# Patient Record
Sex: Female | Born: 1990 | Race: Black or African American | Hispanic: No | Marital: Married | Smoking: Never smoker
Health system: Southern US, Community
[De-identification: ages and names within clinical notes are randomized; demographics above are authoritative.]

## PROBLEM LIST (undated history)

## (undated) DIAGNOSIS — F319 Bipolar disorder, unspecified: Secondary | ICD-10-CM

## (undated) DIAGNOSIS — D649 Anemia, unspecified: Secondary | ICD-10-CM

## (undated) DIAGNOSIS — F259 Schizoaffective disorder, unspecified: Secondary | ICD-10-CM

## (undated) DIAGNOSIS — D696 Thrombocytopenia, unspecified: Secondary | ICD-10-CM

## (undated) DIAGNOSIS — O99119 Other diseases of the blood and blood-forming organs and certain disorders involving the immune mechanism complicating pregnancy, unspecified trimester: Secondary | ICD-10-CM

## (undated) DIAGNOSIS — O139 Gestational [pregnancy-induced] hypertension without significant proteinuria, unspecified trimester: Secondary | ICD-10-CM

## (undated) HISTORY — PX: NEPHRECTOMY: SHX65

---

## 2012-03-09 HISTORY — PX: NEPHRECTOMY: SHX65

## 2020-03-09 NOTE — L&D Delivery Note (Addendum)
OB/GYN Faculty Practice Delivery Note  Holly Craig is a 30 y.o. [id number] s/p SVD at [redacted]w[redacted]d. She was admitted for SROM with contractions.   ROM: 13h 9m with clear fluid - per patient GBS Status: Negative/-- (09/09 1104) Maximum Maternal Temperature: 98.9  Labor Progress: Patient presented to L&D for SROM. Initial SVE: 4/70/-2. She then progressed to complete with Pitocin augmentation.   Delivery Date/Time: 12/25/2020 @2019  Delivery: Called to room and patient was complete and pushing. Head delivered ROA. No nuchal cord present. Shoulder and body delivered in usual fashion. Infant with spontaneous cry, placed on mother's abdomen, dried and stimulated. Cord clamped x 2 after 1-minute delay, and cut by myself. Cord blood drawn. Placenta delivered spontaneously with gentle cord traction. Fundus firm with massage and Pitocin. Labia, perineum, vagina, and cervix inspected without any lacerations appreciated. Placenta was examined after and found to be intact with normal 3-vessel cord.   Placenta: Intact Complications: None Lacerations: None EBL: Analgesia: Epidural  Postpartum Planning [x]  message to sent to schedule follow-up   Infant: APGARs 8/9  pending weight  , DO 12/25/2020, 8:35 PM PGY-1, [facility]  Patient is a Holly Craig at [redacted]w[redacted]d who was admitted in latent labor with significant hx of 2 VBACs in between previous C-sections, psych history, onset of care at 34wks, and gest thrombocytopenia. She progressed with augmentation via Pitocin.  I was gloved and present for delivery in its entirety.  Second stage of labor progressed, baby delivered after one contraction.  Mild decels during second stage noted.  Complications: none  Lacerations: none  EBL: 100cc  Holly Craig, CNM 10:29 PM 12/25/2020

## 2020-09-27 ENCOUNTER — Inpatient Hospital Stay (HOSPITAL_COMMUNITY)
Admission: AD | Admit: 2020-09-27 | Discharge: 2020-09-27 | Disposition: A | Discharge status: Home / Self Care | Payer: Medicaid Other | Attending: Obstetrics & Gynecology | Admitting: Obstetrics & Gynecology

## 2020-09-27 ENCOUNTER — Other Ambulatory Visit: Payer: Self-pay

## 2020-09-27 ENCOUNTER — Encounter (HOSPITAL_COMMUNITY): Payer: Self-pay | Admitting: Obstetrics & Gynecology

## 2020-09-27 DIAGNOSIS — O0932 Supervision of pregnancy with insufficient antenatal care, second trimester: Secondary | ICD-10-CM

## 2020-09-27 DIAGNOSIS — O26893 Other specified pregnancy related conditions, third trimester: Secondary | ICD-10-CM | POA: Insufficient documentation

## 2020-09-27 DIAGNOSIS — Z3A27 27 weeks gestation of pregnancy: Secondary | ICD-10-CM | POA: Insufficient documentation

## 2020-09-27 NOTE — MAU Note (Signed)
Pt reports she is here to get a check up, states she is 6 months pregnant and has not had any care. Denies pain, bleeding or ROM. Reports good fetal movement.

## 2020-09-27 NOTE — MAU Provider Note (Signed)
Event Date/Time   First Provider Initiated Contact with Patient 09/27/20 2247      S Ms. Holly Craig is a 30 y.o. G1P0 patient who presents to MAU wanting a check up. Patient reports that she just moved from [place] 2 weeks ago and has not had any prenatal visits recently. She denies pain or contractions, leaking fluid, or vaginal bleeding. No headache, vision changes, or RUQ/epigastric pain. Endorses active fetal movement.     O BP 122/75 (BP Location: Right Arm)   Pulse 86   Temp 98 F (36.7 C)   Resp 18   Ht 5\' 1"  (1.549 m)   Wt 96.2 kg   SpO2 100%   BMI 40.06 kg/m   FHR: 132 bpm via doppler  Physical Exam Constitutional:      Appearance: She is obese.  Cardiovascular:     Rate and Rhythm: Normal rate.  Pulmonary:     Effort: Pulmonary effort is normal.  Abdominal:     Palpations: Abdomen is soft.     Tenderness: There is no abdominal tenderness.     Comments: Gravid   Musculoskeletal:        General: Normal range of motion.  Neurological:     General: No focal deficit present.     Mental Status: She is alert and oriented to person, place, and time.  Psychiatric:        Mood and Affect: Mood normal.        Behavior: Behavior normal.        Thought Content: Thought content normal.        Judgment: Judgment normal.    A Medical screening exam complete [redacted] weeks gestation of pregnancy   P Discharge from MAU in stable condition Message sent to [facility] to schedule new ob ASAP. Patient informed someone will call to schedule appointment Instructed patient to bring prenatal records to appointment Warning signs for worsening condition that would warrant emergency follow-up discussed Patient may return to MAU as needed     [facility], MSN, CNM 09/27/2020 10:59 PM

## 2020-09-27 NOTE — Discharge Instructions (Signed)
Safe Medications in Pregnancy    Acne: Benzoyl Peroxide Salicylic Acid  Backache/Headache: Tylenol: 2 regular strength every 4 hours OR              2 Extra strength every 6 hours  Colds/Coughs/Allergies: Benadryl (alcohol free) 25 mg every 6 hours as needed Breath right strips Claritin Cepacol throat lozenges Chloraseptic throat spray Cold-Eeze- up to three times per day Cough drops, alcohol free Flonase (by prescription only) Guaifenesin Mucinex Robitussin DM (plain only, alcohol free) Saline nasal spray/drops Sudafed (pseudoephedrine) & Actifed ** use only after [redacted] weeks gestation and if you do not have high blood pressure Tylenol Vicks Vaporub Zinc lozenges Zyrtec   Constipation: Colace Ducolax suppositories Fleet enema Glycerin suppositories Metamucil Milk of magnesia Miralax Senokot Smooth move tea  Diarrhea: Kaopectate Imodium A-D  *NO pepto Bismol  Hemorrhoids: Anusol Anusol HC Preparation H Tucks  Indigestion: Tums Maalox Mylanta Zantac  Pepcid  Insomnia: Benadryl (alcohol free) 25mg  every 6 hours as needed Tylenol PM Unisom, no Gelcaps  Leg Cramps: Tums MagGel  Nausea/Vomiting:  Bonine Dramamine Emetrol Ginger extract Sea bands Meclizine  Nausea medication to take during pregnancy:  Unisom (doxylamine succinate 25 mg tablets) Take one tablet daily at bedtime. If symptoms are not adequately controlled, the dose can be increased to a maximum recommended dose of two tablets daily (1/2 tablet in the morning, 1/2 tablet mid-afternoon and one at bedtime). Vitamin B6 100mg  tablets. Take one tablet twice a day (up to 200 mg per day).  Skin Rashes: Aveeno products Benadryl cream or 25mg  every 6 hours as needed Calamine Lotion 1% cortisone cream  Yeast infection: Gyne-lotrimin 7 Monistat 7   **If taking multiple medications, please check labels to avoid duplicating the same active ingredients **take  medication as directed on the label ** Do not exceed 4000 mg of tylenol in 24 hours **Do not take medications that contain aspirin or ibuprofen   [facility] Guide (Revised August 2014)    Insufficient Money for Medicine:           Way: call "211"   MAP Program at [facility] Department - [place] [phone] or [place] [phone]            No Primary Care Doctor:  To locate a primary care doctor that accepts your insurance or provides certain services:           [place] [facility]: [phone]           Physician Referral Service: [phone] ask for "My [place]" If no insurance, you need to see if you qualify for [facility] "orange card", call to set      up appointment for eligibility/enrollment at [phone] or [phone] or visit [facility]. of Health and [phone] ([address], [place] and [address]) to meet with a [facility] enrollment specialist.  Agencies that provide inexpensive (sliding fee scale) medical care:      [facility] - Family Medicine at [place] - [phone]    [facility]  -  [facility] [phone]    [facility] Internal Medicine - [phone]    [facility] Care & Wellness - [phone]    [facility] [phone]    [facility] Health Family Practice [phone] [facility] - [facility] Child Health @ [place] [phone] [facility] - [place] [facility]  [facility] @ [place] - [phone] [facility]: [phone]  Women's Clinic: [phone]  Planned Parenthood: [phone]  [facility] [phone]    Medicaid-accepting [facility] Providers:           [facility] - [phone] (No Family Planning accepted)          [address], [phone], Mon-Fri 9am-5pm          [facility] - [phone] [address] [place], Suite [place], [place] 8am-5pm, Fri 8am-noon [employer] - [phone]          [address], Mon-Fri 7:30am-4:30pm          [facility] Family Medicine - [phone]          [address], [place] 8am-5pm          [facility] - [phone] [address]          Only accepts [place] [employer] patients after they have their name applied to their card  Self Pay (no insurance) in [facility]:           Sickle Cell Patients:  [address] [place], [phone] [facility] Internal Medicine: [address] [place] [phone]       [facility] and Wellness [address] [place] [phone]  [facility] Family Practice: [address] [phone]          [facility] Urgent Care           [address] [place], [phone] [facility] for Children [address] [place], [phone]           [facility]gent Care [place]           [address], [place] [phone]        [facility] - [address]           [phone], Mon-Fri 9am-7pm, [place] 9am-1pm          [facility] - Family Medicine @ [facility]          [address] [place], [phone]          [facility] - [facility]           [address] [phone] [facility] - [facility] [address] [place] [phone]          [facility]           [address] [phone]  [facility] - [facility]  [address] [place], [phone] [facility] - [facility] [address] [phone]  Dr. Julio Sicks           [address], [place], [phone]          [facility] Urgent Care           [address] [phone]          [facility]             [address], [phone]  [facility]           [address] [place], [phone], 1st & 3rd Saturday every month, 10am-1pm OTHERS:  [employer]  [employer] Only)  [phone] (Thursday only) Strategies for finding a Primary Care Provider:  1) Find a Doctor and Pay Out of Pocket  Although you won't have to find out who is covered by your insurance plan, it is a good idea to ask around and get recommendations. You will then need to call the office and see if the doctor you have chosen will accept you as a new patient and what types of options they offer for patients who are self-pay. Some doctors offer discounts or will set up payment plans for their patients who do not have insurance, but you will need to ask so you aren't surprised when you get to your appointment.  2) Contact [place] [employer] - To see if you qualify for "orange card" access to healthcare safety net providers.  Call for appointment for eligibility/enrollment at [phone] or [phone]. (Uninsured, 0-200% FPL, qualifying info)  Applicants for [facility] are first required to see if they are eligible to enroll in the [facility] Marketplace before enrolling in [facility] (and get an exemption if they are not).  GCCN Criteria for acceptance is:  Proof of Engineering geologist exemption - form or documentation  Valid photo ID (driver's license, state identification card, passport, home country ID)  Proof of [facility] residency (e.g. driver's license, lease/landlord information, pay stubs with address, utility bill, bank statement, etc.)  Proof of income (1040, last year's tax return, W2, 4 current pay stubs, other income proof)  Proof of assets (current bank statement + 3 most recent, disability paperwork, life insurance info, tax value on autos, etc.)  3) Contact Your Local Health Department  Not all health departments have  doctors that can see patients for sick visits, but many do, so it is worth a call to see if yours does. If you don't know where your local health department is, you can check in your phone book. The CDC also has a tool to help you locate your state's health department, and many state websites also have listings of all of their local health departments.  4) Find a Walk-in Clinic  If your illness is not likely to be very severe or complicated, you may want to try a walk in clinic. These are popping up all over the country in pharmacies, drugstores, and shopping centers. They're usually staffed by nurse practitioners or physician assistants that have been trained to treat common illnesses and complaints. They're usually fairly quick and inexpensive. However, if you have serious medical issues or chronic medical problems, these are probably not your best option   STD Testing:           [facility] [place], [place] [facility]           [address] [place], phone [phone] or [phone]           Monday - Friday, call for an appointment          [facility] Department of [facility]), [place] [facility]           [address], [place], phone [phone] or [phone]           Monday - Friday, call for an appointment Abuse/Neglect:           [facility]  Child Abuse Hotline: 213-847-0758           [facility] Child Abuse Hotline: 416-773-4766 (After Hours) Emergency Shelter:  [facility] [phone]  [facility] [place]- [phone]  [facility] [place] - [phone]  Youth Focus - Act Together - 9362420095 (ages 33-17)  Homeless Day Shelter @ [employer] - [phone]   Mammograms - Free at [facility] [phone]  Maternity Homes:           Room at the [place] of the [facility]: [phone]   (Homeless mother with children)          [facility] Alert Services: [phone]  (Mothers only) Youth Focus: [phone] (Pregnant 70-38 years old) Adopt a Mom -([phone]  [facility]  [facility] - Lanae Boast [address] [place] [phone]          [facility]           [address] [place]           [phone]          Gypsy           [address] [place], [place]           [phone]          [facility] Dept.           [address], [place]           [phone]          [facility] Mental Health           [phone]          [facility]           [phone]          [facility] [place]           [address]           [phone], [facility] Child Abuse Hotline           912-710-5861           [phone] (After Hours)  Behavioral Health Services /Substance Abuse Resources:           Alcohol and Drug Services: [phone]           [facility]: [phone]          [facility] [address] [facility]: [phone]  Narcotics Helpline - [phone]          [facility]: 6053536353           Residential & Outpatient Substance Abuse Program - [facility] [place]: 216-039-6542 [employer]  Behavioral Health and Wellness Center - 4848147745 Psychological Services:          [facility]: [phone]  Therapeutic Alternatives: [phone]          [facility] Mental Health           [address] [place]           ACCESS LINE: [phone]     (24 Hour) Mobile Crisis:  HELPLINES:  Financial risk analyst on Mental Illness - [place] [phone] [facility] on Mental Illness - [place] [phone] Walk In Crisis Services  [facility] - [address] - [place]  [phone]       [facility] - [phone] or [phone]  [facility] - [phone]  [address] - [employer] [phone]  [facility] [phone]. [address] [place] [phone]   Dental Assistance:  If unable to pay or uninsured, contact: [facility]. to become qualified for the adult dental clinic. Patient must be enrolled in [facility] (uninsured, 0-200% FPL, qualifying info).  Enroll in [facility] first, then see Primary Care Physician assigned to you, the PCP makes a dental referral. [place] [facility] will receive referral and contacts patient for appointment.  Patients with Medicaid           [address], [phone]  [place] [facility] (Children up to 20 + Pregnant Women) - [phone]  [facility] Dentistry - [address] - [address] [phone]  If unable to pay, or uninsured: contact [facility] Department [phone] in [place] - (Children only + Pregnant Women), [phone] in [facility] only) to become qualified for the adult dental clinic  Must see if eligible to enroll in [facility] Marketplace before enrolling into the [facility] (exemption required) [phone] for an appointment)  [employer];   [phone].  If not eligible for ACA, then go by Department of Health and Human Services to see if eligible for "orange card."  [address], [place] and [phone]  Once you get an orange card, you will have a Primary Care home who will then refer you to dental if needed.     Other IT consultant:   [facility] [phone] (ext [phone])   [address]  Dr. Lawrence [place]se[place] - [phone]   [address]    [place] - [phone]   [facility]           [address] [place], [place], [place], [address]           [phone], Ext. [phone]           2nd and 4th Thursday of the month at 6:30am (Simple extractions only - no wisdom teeth or surgery) First come/First serve -First 10 clients  served           [facility] [place], [place] and [place] residents only)          [address] [place], [place], [address]           [phone]                    [facility] Health Department           [phone]          [facility] Health Department          [phone]         [facility] Health Department - Children's Dental Clinic          [phone]   Transportation Options:  Ambulance - 911 - $[phone] per ride Family Member to accompany patient (if stable) [place] [employer] - [phone]  PART - [phone]  Taxi - [phone] - [place]  SCAT - [phone] (Application required)  [facility] - [phone]

## 2020-10-27 ENCOUNTER — Encounter (HOSPITAL_COMMUNITY): Payer: Self-pay | Admitting: Obstetrics and Gynecology

## 2020-10-27 ENCOUNTER — Inpatient Hospital Stay (HOSPITAL_COMMUNITY)
Admission: AD | Admit: 2020-10-27 | Discharge: 2020-10-27 | Disposition: A | Discharge status: Home / Self Care | Payer: Medicaid Other | Attending: Obstetrics and Gynecology | Admitting: Obstetrics and Gynecology

## 2020-10-27 ENCOUNTER — Other Ambulatory Visit: Payer: Self-pay

## 2020-10-27 DIAGNOSIS — N751 Abscess of Bartholin's gland: Secondary | ICD-10-CM | POA: Insufficient documentation

## 2020-10-27 DIAGNOSIS — Z98891 History of uterine scar from previous surgery: Secondary | ICD-10-CM | POA: Insufficient documentation

## 2020-10-27 DIAGNOSIS — Z3A32 32 weeks gestation of pregnancy: Secondary | ICD-10-CM | POA: Insufficient documentation

## 2020-10-27 DIAGNOSIS — Z79899 Other long term (current) drug therapy: Secondary | ICD-10-CM | POA: Insufficient documentation

## 2020-10-27 DIAGNOSIS — O26893 Other specified pregnancy related conditions, third trimester: Secondary | ICD-10-CM | POA: Diagnosis present

## 2020-10-27 DIAGNOSIS — Z3689 Encounter for other specified antenatal screening: Secondary | ICD-10-CM | POA: Insufficient documentation

## 2020-10-27 DIAGNOSIS — R102 Pelvic and perineal pain: Secondary | ICD-10-CM | POA: Diagnosis not present

## 2020-10-27 DIAGNOSIS — F319 Bipolar disorder, unspecified: Secondary | ICD-10-CM | POA: Insufficient documentation

## 2020-10-27 DIAGNOSIS — F259 Schizoaffective disorder, unspecified: Secondary | ICD-10-CM | POA: Insufficient documentation

## 2020-10-27 DIAGNOSIS — O23593 Infection of other part of genital tract in pregnancy, third trimester: Secondary | ICD-10-CM | POA: Diagnosis not present

## 2020-10-27 DIAGNOSIS — Z8759 Personal history of other complications of pregnancy, childbirth and the puerperium: Secondary | ICD-10-CM | POA: Insufficient documentation

## 2020-10-27 HISTORY — DX: Anemia, unspecified: D64.9

## 2020-10-27 LAB — OB RESULTS CONSOLE GC/CHLAMYDIA: Gonorrhea: NEGATIVE

## 2020-10-27 LAB — WET PREP, GENITAL
Clue Cells Wet Prep HPF POC: NONE SEEN
Sperm: NONE SEEN
Trich, Wet Prep: NONE SEEN
WBC, Wet Prep HPF POC: NONE SEEN
Yeast Wet Prep HPF POC: NONE SEEN

## 2020-10-27 LAB — URINALYSIS, ROUTINE W REFLEX MICROSCOPIC
Bilirubin Urine: NEGATIVE
Glucose, UA: NEGATIVE mg/dL
Hgb urine dipstick: NEGATIVE
Ketones, ur: NEGATIVE mg/dL
Leukocytes,Ua: NEGATIVE
Nitrite: NEGATIVE
Protein, ur: 30 mg/dL — AB
Specific Gravity, Urine: 1.029 (ref 1.005–1.030)
pH: 5 (ref 5.0–8.0)

## 2020-10-27 MED ORDER — LIDOCAINE HCL (PF) 1 % IJ SOLN
30.0000 mL | Freq: Once | INTRAMUSCULAR | Status: DC
  Filled 2020-10-27: qty 30

## 2020-10-27 NOTE — MAU Note (Deleted)
Prior to transfer to [place] vaginal swabs collected-perineum dry without evidence of vaginal bleeding or discharge.

## 2020-10-27 NOTE — MAU Provider Note (Signed)
History     CSN: [id number]  Arrival date and time: 10/27/20 1653   Event Date/Time   First Provider Initiated Contact with Patient 10/27/20 1834      Chief Complaint  Patient presents with   Groin Swelling   30 y.o. [id number] @32 .1 wks presenting with pain and bump in vaginal area. Reports onset of swelling and pain 3 days ago. States she had this before in the Spring and was opened and drained. She denies VB, LOF, or abd pain. Reports +FM. [place] about 2 mos ago and has not started care yet. Hx of Bipolar, has not been taking the Haldol.   OB History     Gravida  10   Para  4   Term  4   Preterm      AB  5   Living  4      SAB  5   IAB      Ectopic      Multiple      Live Births  4           Past Medical History:  Diagnosis Date   Anemia     Past Surgical History:  Procedure Laterality Date   CESAREAN SECTION     NEPHRECTOMY      History reviewed. No pertinent family history.  Social History   Substance Use Topics   Alcohol use: Not Currently   Drug use: Never    Allergies:  Allergies  Allergen Reactions   Ibuprofen     Medications Prior to Admission  Medication Sig Dispense Refill Last Dose   Prenatal Vit-Fe Fumarate-FA (MULTIVITAMIN-PRENATAL) 27-0.8 MG TABS tablet Take 1 tablet by mouth daily at 12 noon.   10/27/2020    Review of Systems  Gastrointestinal:  Negative for abdominal pain.  Genitourinary:  Positive for vaginal pain. Negative for vaginal bleeding and vaginal discharge.  Physical Exam   Blood pressure 129/85, pulse 88, temperature 98.1 F (36.7 C), resp. rate 16, height 5\' 1"  (1.549 m), weight 99.9 kg, SpO2 97 %.  Physical Exam Vitals and nursing note reviewed.  Constitutional:      General: She is not in acute distress.    Appearance: Normal appearance.  HENT:     Head: Normocephalic and atraumatic.  Cardiovascular:     Rate and Rhythm: Normal rate.  Pulmonary:     Effort: Pulmonary  effort is normal. No respiratory distress.  Abdominal:     Palpations: Abdomen is soft.     Tenderness: There is no abdominal tenderness.  Genitourinary:   Musculoskeletal:        General: Normal range of motion.     Cervical back: Normal range of motion.  Skin:    General: Skin is warm.  Neurological:     General: No focal deficit present.     Mental Status: She is alert and oriented to person, place, and time.  Psychiatric:        Mood and Affect: Mood normal.        Behavior: Behavior normal.  EFM: 145 bpm, mod variability, + accels, no decels Toco: rare  Results for orders placed or performed during the hospital encounter of 10/27/20 (from the past 24 hour(s))  Urinalysis, Routine w reflex microscopic Urine, Clean Catch     Status: Abnormal   Collection Time: 10/27/20  5:34 PM  Result Value Ref Range   Color, Urine YELLOW YELLOW   APPearance CLOUDY (A) CLEAR   Specific  Gravity, Urine 1.029 1.005 - 1.030   pH 5.0 5.0 - 8.0   Glucose, UA NEGATIVE NEGATIVE mg/dL   Hgb urine dipstick NEGATIVE NEGATIVE   Bilirubin Urine NEGATIVE NEGATIVE   Ketones, ur NEGATIVE NEGATIVE mg/dL   Protein, ur 30 (A) NEGATIVE mg/dL   Nitrite NEGATIVE NEGATIVE   Leukocytes,Ua NEGATIVE NEGATIVE   RBC / HPF 0-5 0 - 5 RBC/hpf   WBC, UA 0-5 0 - 5 WBC/hpf   Bacteria, UA MANY (A) NONE SEEN   Squamous Epithelial / LPF 21-50 0 - 5   Mucus PRESENT   Wet prep, genital     Status: None   Collection Time: 10/27/20  6:35 PM   Specimen: Vaginal  Result Value Ref Range   Yeast Wet Prep HPF POC NONE SEEN NONE SEEN   Trich, Wet Prep NONE SEEN NONE SEEN   Clue Cells Wet Prep HPF POC NONE SEEN NONE SEEN   WBC, Wet Prep HPF POC NONE SEEN NONE SEEN   Sperm NONE SEEN    MAU Course  Procedures  Consent form signed. Time out.   Patient positioned and draped with sterile towels.  Preoperative medication: Percocet 2 tablet po    Area cleaned with betadine Local infiltrate with lidocaine 1%. Amount 5  ccs  I&D Scalpel size: #11blade Incision type: Straight single Complexity: Complex Drained moderate amount of purulent drainage Probed with curved hemostat to break up loculations Packing: none Patient tolerance: Tolerated procedure well.    MDM Reviewed records in CareEverywhere from [place]: last prenatal visit at 12 wks, hx of severe pre-e in first pregnancy, hx VBAC x2, hx Bipolar. Labs ordered and reviewed. Stable for discharge home.  Assessment and Plan   1. [redacted] weeks gestation of pregnancy   2. NST (non-stress test) reactive   3. Bartholin's gland abscess    Discharge home Follow up at [facility] in 1 week- message sent Return precautions Tylenol prn  Allergies as of 10/27/2020       Reactions   Ibuprofen         Medication List     TAKE these medications    multivitamin-prenatal 27-0.8 MG Tabs tablet Take 1 tablet by mouth daily at 12 noon.       Donette Larry, CNM 10/27/2020, 8:06 PM

## 2020-10-27 NOTE — MAU Note (Signed)
Peri pad check prior to discharge-noted quarter size bright red blood spot-pt denies pain-no active bleeding or swelling noted. Reviewed discharge and follow up instructions with pt and family following providers instruction. Questions answered. Discharge papers in hand.

## 2020-10-27 NOTE — MAU Note (Signed)
Holly Craig is a 30 y.o. at [redacted]w[redacted]d here in MAU reporting: left sided vaginal swelling and pressure since 8/18. States she has noticed some bumps and had some bumps before and had surgery to that area and some medicine. This she has an infection but cannot recall what kind. No bleeding. +FM. No abdominal pain. Pt is not receiving PNC.  Onset of complaint: ongoing  Pain score: 0/10  Vitals:   10/27/20 1731  BP: (!) 120/95  Pulse: 96  Resp: 16  Temp: 98.1 F (36.7 C)  SpO2: 95%     FHT: doppler deferred, pt wearing a long dress, reporting +FM  Lab orders placed from triage: UA

## 2020-10-28 LAB — GC/CHLAMYDIA PROBE AMP (~~LOC~~) NOT AT ARMC
Chlamydia: NEGATIVE
Comment: NEGATIVE
Comment: NORMAL
Neisseria Gonorrhea: NEGATIVE

## 2020-11-13 ENCOUNTER — Encounter: Payer: Medicaid Other | Admitting: Nurse Practitioner

## 2020-11-15 ENCOUNTER — Encounter: Payer: Self-pay | Admitting: Obstetrics and Gynecology

## 2020-11-15 ENCOUNTER — Other Ambulatory Visit: Payer: Self-pay

## 2020-11-15 ENCOUNTER — Other Ambulatory Visit (HOSPITAL_COMMUNITY)
Admission: RE | Admit: 2020-11-15 | Discharge: 2020-11-15 | Disposition: A | Discharge status: Home / Self Care | Payer: Medicaid Other | Source: Ambulatory Visit | Attending: Obstetrics and Gynecology | Admitting: Obstetrics and Gynecology

## 2020-11-15 ENCOUNTER — Ambulatory Visit (INDEPENDENT_AMBULATORY_CARE_PROVIDER_SITE_OTHER): Payer: Medicaid Other | Admitting: Obstetrics and Gynecology

## 2020-11-15 VITALS — BP 107/73 | HR 96 | Wt 222.3 lb

## 2020-11-15 DIAGNOSIS — Z3A34 34 weeks gestation of pregnancy: Secondary | ICD-10-CM

## 2020-11-15 DIAGNOSIS — Z905 Acquired absence of kidney: Secondary | ICD-10-CM

## 2020-11-15 DIAGNOSIS — F319 Bipolar disorder, unspecified: Secondary | ICD-10-CM

## 2020-11-15 DIAGNOSIS — O2343 Unspecified infection of urinary tract in pregnancy, third trimester: Secondary | ICD-10-CM

## 2020-11-15 DIAGNOSIS — Z8759 Personal history of other complications of pregnancy, childbirth and the puerperium: Secondary | ICD-10-CM

## 2020-11-15 DIAGNOSIS — O0993 Supervision of high risk pregnancy, unspecified, third trimester: Secondary | ICD-10-CM | POA: Diagnosis present

## 2020-11-15 DIAGNOSIS — Z98891 History of uterine scar from previous surgery: Secondary | ICD-10-CM

## 2020-11-15 DIAGNOSIS — F259 Schizoaffective disorder, unspecified: Secondary | ICD-10-CM

## 2020-11-15 DIAGNOSIS — D696 Thrombocytopenia, unspecified: Secondary | ICD-10-CM

## 2020-11-15 DIAGNOSIS — O99113 Other diseases of the blood and blood-forming organs and certain disorders involving the immune mechanism complicating pregnancy, third trimester: Secondary | ICD-10-CM

## 2020-11-15 DIAGNOSIS — O0990 Supervision of high risk pregnancy, unspecified, unspecified trimester: Secondary | ICD-10-CM

## 2020-11-15 DIAGNOSIS — O99810 Abnormal glucose complicating pregnancy: Secondary | ICD-10-CM

## 2020-11-15 DIAGNOSIS — Z6841 Body Mass Index (BMI) 40.0 and over, adult: Secondary | ICD-10-CM

## 2020-11-15 DIAGNOSIS — O0930 Supervision of pregnancy with insufficient antenatal care, unspecified trimester: Secondary | ICD-10-CM | POA: Insufficient documentation

## 2020-11-15 DIAGNOSIS — O0933 Supervision of pregnancy with insufficient antenatal care, third trimester: Secondary | ICD-10-CM

## 2020-11-15 DIAGNOSIS — O99210 Obesity complicating pregnancy, unspecified trimester: Secondary | ICD-10-CM

## 2020-11-15 NOTE — Progress Notes (Signed)
PRENATAL VISIT NOTE  Transfer of care visit  Patient has just moved here from [place]  Subjective:  Holly Craig is a 30 y.o. [id number] at [redacted]w[redacted]d being seen today for ongoing prenatal care.  She is currently monitored for the following issues for this high-risk pregnancy and has History of severe pre-eclampsia; Schizoaffective disorder (HCC); Bipolar 1 disorder (HCC); History of cesarean delivery; Late prenatal care; Supervision of high risk pregnancy, antepartum; and Insufficient prenatal care in third trimester on their problem list.  Patient reports no complaints.  Contractions: Not present. Vag. Bleeding: None.  Movement: Present. Denies leaking of fluid.   The following portions of the patient's history were reviewed and updated as appropriate: allergies, current medications, past family history, past medical history, past social history, past surgical history and problem list.   Objective:   Vitals:   11/15/20 1002  BP: 107/73  Pulse: 96  Weight: 222 lb 4.8 oz (100.8 kg)    Fetal Status: Fetal Heart Rate (bpm): 143 Fundal Height: 34 cm Movement: Present  Presentation: Vertex  General:  Alert, oriented and cooperative. Patient is in no acute distress.  Skin: Skin is warm and dry. No rash noted.   Cardiovascular: Normal heart rate noted  Respiratory: Normal respiratory effort, no problems with respiration noted  Abdomen: Soft, gravid, appropriate for gestational age.  Pain/Pressure: Absent     Pelvic: Cervical exam performed in the presence of a chaperone EGBUS, vaginal vault and cervix normal; visually closed, long  Extremities: Normal range of motion.  Edema: Trace  Mental Status: Normal mood and affect. Normal behavior. Normal judgment and thought content.   Assessment and Plan:  Pregnancy: [id number] at 101w6d 1. Supervision of high risk pregnancy in third trimester Patient states she was getting her care at the [facility] but there is nothing in care everywhere  except for her mental health notes (see below). She states there were no issues with her pregnancy  GBS and OB labs done today and MFM u/s appt made.   Pt declines BTL.   - Cytology - PAP( Mondamin) - CBC/D/Plt+RPR+Rh+ABO+Rub Ab... - Glucose tolerance, 1 hour - [place] MFM OB DETAIL +14 WK; Future - Culture, OB Urine - Comprehensive metabolic panel - Protein / creatinine ratio, urine  2. Bipolar 1 disorder (HCC) Extensive mental health notes from [facility] in care everywhere with last visit on 9/6 and it looks like they were seeing her weekly and she had two inpatient admissions this year.   It looks like she last got IM Haldol in May with Fonda Kinder  It also looks like they were trying for many weeks to set her up with the [facility] program; pt is unaware of any visits and there is nothing in care everywhere and it looks like they left a VM for the director. I called [facility] but they could not give me the number for Pamalee Leyden and I called the clinic at [phone] but no answer. I left a VM with my number to call me back. Asher Muir with behavorial health is not here today but referral made to her and message sent to her about resources for patient.   I called across the street and they said she came come anytime to the urgent care but next provider visit is out until November and no way for her to be seen earlier. They also said that they do not do weekly sessions. Pt currently denies any auditory or visual hallucinations but her affect  today is somewhat flat. I told her about the facility across the street to go to 24/7 for any issues.   I called [facility] but they do not do medication management - Ambulatory referral to Integrated Behavioral Health  3. Schizoaffective disorder, unspecified type (HCC) - Ambulatory referral to Integrated Behavioral Health  4. Insufficient prenatal care in third trimester  5. Supervision of high risk pregnancy, antepartum - Strep Gp B NAA  6. History of cesarean  delivery Request sent for 39wk c/s  7. History of severe pre-eclampsia  8. BMI 40.0-44.9, adult (HCC)  9. Obesity in pregnancy  10. [redacted] weeks gestation of pregnancy  11. History of nephrectomy, left She states she had pain in this area and they said it was damaged; removed in 2014.  Cmp today  Preterm labor symptoms and general obstetric precautions including but not limited to vaginal bleeding, contractions, leaking of fluid and fetal movement were reviewed in detail with the patient. Please refer to After Visit Summary for other counseling recommendations.   No follow-ups on file.  Future Appointments  Date Time Provider Department Center  11/26/2020  7:30 AM [facility] NURSE [facility]  11/26/2020  7:45 AM [facility] US4 [facility]    [place] Bing, MD

## 2020-11-16 LAB — CBC/D/PLT+RPR+RH+ABO+RUBIGG...
Antibody Screen: NEGATIVE
Basophils Absolute: 0 10*3/uL (ref 0.0–0.2)
Basos: 0 %
EOS (ABSOLUTE): 0.1 10*3/uL (ref 0.0–0.4)
Eos: 1 %
HCV Ab: <0.1 s/co ratio (ref 0.0–0.9)
HIV Screen 4th Generation wRfx: NONREACTIVE
Hematocrit: 33.6 % — ABNORMAL LOW (ref 34.0–46.6)
Hemoglobin: 11.3 g/dL (ref 11.1–15.9)
Hepatitis B Surface Ag: NEGATIVE
Immature Grans (Abs): 0.1 10*3/uL (ref 0.0–0.1)
Immature Granulocytes: 1 %
Lymphocytes Absolute: 1.3 10*3/uL (ref 0.7–3.1)
Lymphs: 25 %
MCH: 28.7 pg (ref 26.6–33.0)
MCHC: 33.6 g/dL (ref 31.5–35.7)
MCV: 85 fL (ref 79–97)
Monocytes Absolute: 0.5 10*3/uL (ref 0.1–0.9)
Monocytes: 9 %
Neutrophils Absolute: 3.3 10*3/uL (ref 1.4–7.0)
Neutrophils: 64 %
Platelets: 86 10*3/uL — CL (ref 150–450)
RBC: 3.94 x10E6/uL (ref 3.77–5.28)
RDW: 13.6 % (ref 11.7–15.4)
RPR Ser Ql: NONREACTIVE
Rh Factor: POSITIVE
Rubella Antibodies, IGG: 31.1 index (ref >0.99)
WBC: 5.2 10*3/uL (ref 3.4–10.8)

## 2020-11-16 LAB — COMPREHENSIVE METABOLIC PANEL
ALT: 8 IU/L (ref 0–32)
AST: 13 IU/L (ref 0–40)
Albumin/Globulin Ratio: 1.6 (ref 1.2–2.2)
Albumin: 3.8 g/dL (ref 3.8–4.8)
Alkaline Phosphatase: 90 IU/L (ref 44–121)
BUN/Creatinine Ratio: 13 (ref 9–23)
BUN: 7 mg/dL (ref 6–20)
Bilirubin Total: 0.9 mg/dL (ref 0.0–1.2)
CO2: 19 mmol/L — ABNORMAL LOW (ref 20–29)
Calcium: 9.1 mg/dL (ref 8.7–10.2)
Chloride: 103 mmol/L (ref 96–106)
Creatinine, Ser: 0.54 mg/dL — ABNORMAL LOW (ref 0.57–1.00)
Globulin, Total: 2.4 g/dL (ref 1.5–4.5)
Glucose: 151 mg/dL — ABNORMAL HIGH (ref 65–99)
Potassium: 3.9 mmol/L (ref 3.5–5.2)
Sodium: 137 mmol/L (ref 134–144)
Total Protein: 6.2 g/dL (ref 6.0–8.5)
eGFR: 126 mL/min/{1.73_m2} (ref >59)

## 2020-11-16 LAB — PROTEIN / CREATININE RATIO, URINE
Creatinine, Urine: 89 mg/dL
Protein, Ur: 17.1 mg/dL
Protein/Creat Ratio: 192 mg/g creat (ref 0–200)

## 2020-11-16 LAB — HCV INTERPRETATION

## 2020-11-16 LAB — GLUCOSE TOLERANCE, 1 HOUR: Glucose, 1Hr PP: 149 mg/dL (ref 65–199)

## 2020-11-17 LAB — STREP GP B NAA: Strep Gp B NAA: NEGATIVE

## 2020-11-18 ENCOUNTER — Telehealth: Payer: Self-pay

## 2020-11-18 DIAGNOSIS — Z6841 Body Mass Index (BMI) 40.0 and over, adult: Secondary | ICD-10-CM | POA: Insufficient documentation

## 2020-11-18 DIAGNOSIS — D696 Thrombocytopenia, unspecified: Secondary | ICD-10-CM | POA: Insufficient documentation

## 2020-11-18 DIAGNOSIS — O99210 Obesity complicating pregnancy, unspecified trimester: Secondary | ICD-10-CM | POA: Insufficient documentation

## 2020-11-18 DIAGNOSIS — O99113 Other diseases of the blood and blood-forming organs and certain disorders involving the immune mechanism complicating pregnancy, third trimester: Secondary | ICD-10-CM | POA: Insufficient documentation

## 2020-11-18 DIAGNOSIS — Z905 Acquired absence of kidney: Secondary | ICD-10-CM | POA: Insufficient documentation

## 2020-11-18 DIAGNOSIS — O99810 Abnormal glucose complicating pregnancy: Secondary | ICD-10-CM | POA: Insufficient documentation

## 2020-11-18 NOTE — Telephone Encounter (Signed)
-----   Message from [place] Bing, MD sent at 11/18/2020  8:24 AM EDT ----- Can you let her know that she failed her 1h GTT and needs a 2h and that we need to repeat her platelet count to see if it's low? Her next visit is on 9/20, but can you ask her to come in for a lab only visit before that; I've put in future orders.   Also, is there anyway to get her to be seen at the office in [facility] for her OB visits and labs? It looks like it's only a mile from address  Thanks

## 2020-11-18 NOTE — Telephone Encounter (Signed)
Call placed to pt. Spoke with pt. Pt given results and recommendations per Dr Vergie Living. Pt verbalized understanding. Pt scheduled for lab appt 9/15 at 930am and instructed to be fasting. Pt verbalized understanding and agreeable to date and time of appt.  Judeth Cornfield, RN

## 2020-11-21 ENCOUNTER — Other Ambulatory Visit: Payer: Medicaid Other

## 2020-11-21 ENCOUNTER — Other Ambulatory Visit: Payer: Self-pay

## 2020-11-21 DIAGNOSIS — D696 Thrombocytopenia, unspecified: Secondary | ICD-10-CM

## 2020-11-21 DIAGNOSIS — O99113 Other diseases of the blood and blood-forming organs and certain disorders involving the immune mechanism complicating pregnancy, third trimester: Secondary | ICD-10-CM

## 2020-11-21 DIAGNOSIS — O99810 Abnormal glucose complicating pregnancy: Secondary | ICD-10-CM

## 2020-11-21 DIAGNOSIS — O0993 Supervision of high risk pregnancy, unspecified, third trimester: Secondary | ICD-10-CM

## 2020-11-21 LAB — CYTOLOGY - PAP
Comment: NEGATIVE
Diagnosis: NEGATIVE
High risk HPV: NEGATIVE

## 2020-11-21 LAB — URINE CULTURE, OB REFLEX

## 2020-11-21 LAB — CULTURE, OB URINE

## 2020-11-22 ENCOUNTER — Telehealth: Payer: Self-pay | Admitting: Obstetrics and Gynecology

## 2020-11-22 DIAGNOSIS — O2343 Unspecified infection of urinary tract in pregnancy, third trimester: Secondary | ICD-10-CM | POA: Insufficient documentation

## 2020-11-22 LAB — GLUCOSE TOLERANCE, 2 HOURS W/ 1HR
Glucose, 1 hour: 157 mg/dL (ref 65–179)
Glucose, 2 hour: 104 mg/dL (ref 65–152)
Glucose, Fasting: 87 mg/dL (ref 65–91)

## 2020-11-22 LAB — PLATELET COUNT: Platelets: 93 10*3/uL — CL (ref 150–450)

## 2020-11-22 MED ORDER — CEFADROXIL 500 MG PO CAPS: 500.0000 mg | ORAL_CAPSULE | Freq: Two times a day (BID) | ORAL | 0 refills | Status: DC

## 2020-11-22 NOTE — Addendum Note (Signed)
Addended by: [place] Bing on: 11/22/2020 01:53 PM   Modules accepted: Orders

## 2020-11-26 ENCOUNTER — Encounter: Payer: Medicaid Other | Admitting: Family Medicine

## 2020-11-26 ENCOUNTER — Telehealth: Payer: Self-pay | Admitting: Clinical

## 2020-11-26 ENCOUNTER — Encounter: Payer: Self-pay | Admitting: *Deleted

## 2020-11-26 ENCOUNTER — Ambulatory Visit: Payer: Medicaid Other | Attending: Obstetrics and Gynecology

## 2020-11-26 ENCOUNTER — Other Ambulatory Visit: Payer: Self-pay

## 2020-11-26 ENCOUNTER — Ambulatory Visit: Payer: Medicaid Other | Admitting: *Deleted

## 2020-11-26 VITALS — BP 110/73 | HR 85

## 2020-11-26 DIAGNOSIS — Z98891 History of uterine scar from previous surgery: Secondary | ICD-10-CM | POA: Insufficient documentation

## 2020-11-26 DIAGNOSIS — Z8759 Personal history of other complications of pregnancy, childbirth and the puerperium: Secondary | ICD-10-CM | POA: Diagnosis present

## 2020-11-26 DIAGNOSIS — F319 Bipolar disorder, unspecified: Secondary | ICD-10-CM | POA: Diagnosis present

## 2020-11-26 DIAGNOSIS — O0993 Supervision of high risk pregnancy, unspecified, third trimester: Secondary | ICD-10-CM | POA: Insufficient documentation

## 2020-11-26 NOTE — Telephone Encounter (Signed)
Attempt to follow up with pt regarding ongoing BH care; Left HIPPA-compliant message to call back Asher Muir from [employer] for [employer] at [facility] at  [phone] [facility] office).

## 2020-11-27 ENCOUNTER — Telehealth: Payer: Self-pay | Admitting: *Deleted

## 2020-11-27 ENCOUNTER — Encounter: Payer: Self-pay | Admitting: *Deleted

## 2020-11-27 NOTE — Telephone Encounter (Signed)
Call to patient. Female answers and states he is not with patient. Requested call back to office. Female states he is Holly Post, her husband and able to give information. Advised calling about procedure scheduling- he is only aware of appointment  next week. Advised calling about future appointments and will speak next week at appointment.    Letter will be mailed.

## 2020-12-03 ENCOUNTER — Telehealth (HOSPITAL_COMMUNITY): Payer: Self-pay | Admitting: *Deleted

## 2020-12-03 ENCOUNTER — Encounter (HOSPITAL_COMMUNITY): Payer: Self-pay

## 2020-12-03 NOTE — Telephone Encounter (Signed)
Preadmission screen  

## 2020-12-03 NOTE — Telephone Encounter (Signed)
OB Telephone Note  Patient called and husband answered. Labs and plan of care d/w him. He states that she is doing fine in terms of pscyh  I did hear from [facility] and they feel that she is too far away for their services to be helpful.  Cornelia Copa MD Attending Center for [employer] (Faculty Practice) 11/22/2020

## 2020-12-03 NOTE — Patient Instructions (Signed)
Holly Craig  12/03/2020   Your procedure is scheduled on:  12/16/2020  Arrive at 1030 at Entrance C on [address] at [facility]  and [address]. You are invited to use the FREE valet parking or use the Visitor's parking deck.  Pick up the phone at the desk and dial [phone].  Call this number if you have problems the morning of surgery: [phone]  Remember:   Do not eat food:(After Midnight) Desps de medianoche.  Do not drink clear liquids: (After Midnight) Desps de medianoche.  Take these medicines the morning of surgery with A SIP OF WATER:  none   Do not wear jewelry, make-up or nail polish.  Do not wear lotions, powders, or perfumes. Do not wear deodorant.  Do not shave 48 hours prior to surgery.  Do not bring valuables to the hospital.  [facility] is not   responsible for any belongings or valuables brought to the hospital.  Contacts, dentures or bridgework may not be worn into surgery.  Leave suitcase in the car. After surgery it may be brought to your room.  For patients admitted to the hospital, checkout time is 11:00 AM the day of              discharge.      Please read over the following fact sheets that you were given:     Preparing for Surgery

## 2020-12-04 ENCOUNTER — Telehealth: Payer: Self-pay | Admitting: Clinical

## 2020-12-04 ENCOUNTER — Other Ambulatory Visit: Payer: Self-pay

## 2020-12-04 ENCOUNTER — Other Ambulatory Visit (HOSPITAL_COMMUNITY)
Admission: RE | Admit: 2020-12-04 | Discharge: 2020-12-04 | Disposition: A | Discharge status: Home / Self Care | Payer: Medicaid Other | Source: Ambulatory Visit | Attending: Family Medicine | Admitting: Family Medicine

## 2020-12-04 ENCOUNTER — Ambulatory Visit (INDEPENDENT_AMBULATORY_CARE_PROVIDER_SITE_OTHER): Payer: Medicaid Other | Admitting: Family Medicine

## 2020-12-04 VITALS — BP 124/56 | HR 83 | Wt 228.0 lb

## 2020-12-04 DIAGNOSIS — O99113 Other diseases of the blood and blood-forming organs and certain disorders involving the immune mechanism complicating pregnancy, third trimester: Secondary | ICD-10-CM

## 2020-12-04 DIAGNOSIS — D696 Thrombocytopenia, unspecified: Secondary | ICD-10-CM

## 2020-12-04 DIAGNOSIS — F250 Schizoaffective disorder, bipolar type: Secondary | ICD-10-CM

## 2020-12-04 DIAGNOSIS — F319 Bipolar disorder, unspecified: Secondary | ICD-10-CM

## 2020-12-04 DIAGNOSIS — O0993 Supervision of high risk pregnancy, unspecified, third trimester: Secondary | ICD-10-CM

## 2020-12-04 DIAGNOSIS — Z8759 Personal history of other complications of pregnancy, childbirth and the puerperium: Secondary | ICD-10-CM

## 2020-12-04 DIAGNOSIS — Z98891 History of uterine scar from previous surgery: Secondary | ICD-10-CM

## 2020-12-04 DIAGNOSIS — O0933 Supervision of pregnancy with insufficient antenatal care, third trimester: Secondary | ICD-10-CM

## 2020-12-04 NOTE — Telephone Encounter (Signed)
Attempt to schedule pt for [facility] visit after referral; pt declined [facility] appointment at this time; says she does not need to start back on [facility] medication (Haldol) at this time, has no questions or concerns.   Pt would benefit from psychiatry consult prior to discharge after delivery at [facility], as well as social work consult to assess for any additional needs.

## 2020-12-04 NOTE — Progress Notes (Signed)
   PRENATAL VISIT NOTE  Subjective:  Holly Craig is a 30 y.o. [id number] at [redacted]w[redacted]d being seen today for ongoing prenatal care.  She is currently monitored for the following issues for this high-risk pregnancy and has History of severe pre-eclampsia; Schizoaffective disorder (HCC); Bipolar 1 disorder (HCC); History of cesarean delivery; Late prenatal care; Supervision of high risk pregnancy in third trimester; Insufficient prenatal care in third trimester; Benign gestational thrombocytopenia in third trimester (HCC); History of nephrectomy, left; Obesity in pregnancy; BMI 40.0-44.9, adult (HCC); and UTI (urinary tract infection) in pregnancy, antepartum, third trimester on their problem list.  Patient reports no complaints.  Contractions: Not present. Vag. Bleeding: None.  Movement: Present. Denies leaking of fluid.   The following portions of the patient's history were reviewed and updated as appropriate: allergies, current medications, past family history, past medical history, past social history, past surgical history and problem list.   Objective:   Vitals:   12/04/20 0845  BP: (!) 124/56  Pulse: 83  Weight: 228 lb (103.4 kg)    Fetal Status: Fetal Heart Rate (bpm): 140   Movement: Present     General:  Alert, oriented and cooperative. Patient is in no acute distress.  Skin: Skin is warm and dry. No rash noted.   Cardiovascular: Normal heart rate noted  Respiratory: Normal respiratory effort, no problems with respiration noted  Abdomen: Soft, gravid, appropriate for gestational age.  Pain/Pressure: Absent     Pelvic: Cervical exam deferred        Extremities: Normal range of motion.  Edema: Trace  Mental Status: Normal mood and affect. Normal behavior. Normal judgment and thought content.   Assessment and Plan:  Pregnancy: [id number] at [redacted]w[redacted]d 1. Supervision of high risk pregnancy in third trimester FHT and FH normal - Platelet count; Future - GC/Chlamydia probe amp ([facility])not at [facility]  2. Schizoaffective disorder, bipolar type (HCC) Will schedule with Jamie  3. Bipolar 1 disorder (HCC) No currently treated. Will schedule with Marijean Niemann. Not able to get into [facility]  4. Insufficient prenatal care in third trimester  5. History of severe pre-eclampsia BP normal  6. History of cesarean delivery Desires TOLAC. Has two successful VBACs. Last c/s for malposition. Does not want to be induced.  TOLAC consent form reviewed and signed by patient.  7. Benign gestational thrombocytopenia in third trimester (HCC) Last platelets >90. Recheck today.  May need steroids. I discussed potential for delivery at 39 weeks, but she does not want to be induced or scheduled for c/s.  Term labor symptoms and general obstetric precautions including but not limited to vaginal bleeding, contractions, leaking of fluid and fetal movement were reviewed in detail with the patient. Please refer to After Visit Summary for other counseling recommendations.   No follow-ups on file.  Future Appointments  Date Time Provider Department Center  12/13/2020 10:00 AM [facility] PAT 1 [facility] None  01/01/2021 11:15 AM Levie Heritage, DO [facility] None  01/29/2021 10:15 AM Adrian Blackwater Rhona Raider, DO [facility] None    Levie Heritage, DO

## 2020-12-05 LAB — GC/CHLAMYDIA PROBE AMP (~~LOC~~) NOT AT ARMC
Chlamydia: NEGATIVE
Comment: NEGATIVE
Comment: NORMAL
Neisseria Gonorrhea: NEGATIVE

## 2020-12-05 LAB — PLATELET COUNT: Platelets: 98 10*3/uL — CL (ref 150–450)

## 2020-12-11 ENCOUNTER — Other Ambulatory Visit: Payer: Self-pay

## 2020-12-11 ENCOUNTER — Ambulatory Visit (INDEPENDENT_AMBULATORY_CARE_PROVIDER_SITE_OTHER): Payer: Medicaid Other | Admitting: Family Medicine

## 2020-12-11 VITALS — BP 125/69 | HR 88 | Wt 228.0 lb

## 2020-12-11 DIAGNOSIS — O0933 Supervision of pregnancy with insufficient antenatal care, third trimester: Secondary | ICD-10-CM

## 2020-12-11 DIAGNOSIS — F319 Bipolar disorder, unspecified: Secondary | ICD-10-CM

## 2020-12-11 DIAGNOSIS — Z98891 History of uterine scar from previous surgery: Secondary | ICD-10-CM

## 2020-12-11 DIAGNOSIS — D696 Thrombocytopenia, unspecified: Secondary | ICD-10-CM

## 2020-12-11 DIAGNOSIS — Z8759 Personal history of other complications of pregnancy, childbirth and the puerperium: Secondary | ICD-10-CM

## 2020-12-11 DIAGNOSIS — O0993 Supervision of high risk pregnancy, unspecified, third trimester: Secondary | ICD-10-CM

## 2020-12-11 DIAGNOSIS — O99113 Other diseases of the blood and blood-forming organs and certain disorders involving the immune mechanism complicating pregnancy, third trimester: Secondary | ICD-10-CM

## 2020-12-11 NOTE — Progress Notes (Signed)
   PRENATAL VISIT NOTE  Subjective:  Holly Craig is a 30 y.o. [id number] at [redacted]w[redacted]d being seen today for ongoing prenatal care.  She is currently monitored for the following issues for this high-risk pregnancy and has History of severe pre-eclampsia; Schizoaffective disorder (HCC); Bipolar 1 disorder (HCC); History of cesarean delivery; Late prenatal care; Supervision of high risk pregnancy in third trimester; Insufficient prenatal care in third trimester; Benign gestational thrombocytopenia in third trimester (HCC); History of nephrectomy, left; Obesity in pregnancy; BMI 40.0-44.9, adult (HCC); and UTI (urinary tract infection) in pregnancy, antepartum, third trimester on their problem list.  Patient reports no complaints.  Contractions: Not present. Vag. Bleeding: None.  Movement: Present. Denies leaking of fluid.   The following portions of the patient's history were reviewed and updated as appropriate: allergies, current medications, past family history, past medical history, past social history, past surgical history and problem list.   Objective:   Vitals:   12/11/20 0935  BP: 125/69  Pulse: 88  Weight: 228 lb (103.4 kg)    Fetal Status: Fetal Heart Rate (bpm): 145 Fundal Height: 38 cm Movement: Present  Presentation: Vertex  General:  Alert, oriented and cooperative. Patient is in no acute distress.  Skin: Skin is warm and dry. No rash noted.   Cardiovascular: Normal heart rate noted  Respiratory: Normal respiratory effort, no problems with respiration noted  Abdomen: Soft, gravid, appropriate for gestational age.  Pain/Pressure: Absent     Pelvic: Cervical exam deferred        Extremities: Normal range of motion.  Edema: Mild pitting, slight indentation  Mental Status: Normal mood and affect. Normal behavior. Normal judgment and thought content.   Assessment and Plan:  Pregnancy: [id number] at [redacted]w[redacted]d 1. Supervision of high risk pregnancy in third trimester FHT and FH  normal  2. Insufficient prenatal care in third trimester  3. History of severe pre-eclampsia BP normal  4. Bipolar 1 disorder (HCC) Not on medications. Patient denies diagnosis  5. History of cesarean delivery Desires VBAC I discussed that we would not induce her with 2 prior c/s. If she gets to 41 weeks, she would be amenable to have c/s.  6. Benign gestational thrombocytopenia in third trimester (HCC) Platelets improved. Recheck next week.  Term labor symptoms and general obstetric precautions including but not limited to vaginal bleeding, contractions, leaking of fluid and fetal movement were reviewed in detail with the patient. Please refer to After Visit Summary for other counseling recommendations.   No follow-ups on file.  Future Appointments  Date Time Provider Department Center  12/13/2020 10:00 AM [facility] PAT 1 [facility] None  01/01/2021 11:15 AM Levie Heritage, DO [facility] None  01/29/2021 10:15 AM Adrian Blackwater Rhona Raider, DO [facility] None    Levie Heritage, DO

## 2020-12-11 NOTE — Progress Notes (Signed)
Patient desires vaginal delivery. Hx of 2 previous c-sections. Armandina Stammer RN

## 2020-12-13 ENCOUNTER — Encounter (HOSPITAL_COMMUNITY)
Admission: RE | Admit: 2020-12-13 | Discharge: 2020-12-13 | Disposition: A | Discharge status: Home / Self Care | Payer: Medicaid Other | Source: Ambulatory Visit | Attending: Family Medicine | Admitting: Family Medicine

## 2020-12-13 HISTORY — DX: Bipolar disorder, unspecified: F31.9

## 2020-12-13 HISTORY — DX: Schizoaffective disorder, unspecified: F25.9

## 2020-12-16 ENCOUNTER — Encounter (HOSPITAL_COMMUNITY): Payer: Self-pay

## 2020-12-16 ENCOUNTER — Encounter (HOSPITAL_COMMUNITY): Admission: RE | Payer: Self-pay | Source: Ambulatory Visit

## 2020-12-16 ENCOUNTER — Inpatient Hospital Stay (HOSPITAL_COMMUNITY): Admission: RE | Admit: 2020-12-16 | Payer: Medicaid Other | Source: Ambulatory Visit | Admitting: Family Medicine

## 2020-12-16 DIAGNOSIS — Z98891 History of uterine scar from previous surgery: Secondary | ICD-10-CM

## 2020-12-16 DIAGNOSIS — Z8759 Personal history of other complications of pregnancy, childbirth and the puerperium: Secondary | ICD-10-CM

## 2020-12-16 DIAGNOSIS — F250 Schizoaffective disorder, bipolar type: Secondary | ICD-10-CM

## 2020-12-16 DIAGNOSIS — O0993 Supervision of high risk pregnancy, unspecified, third trimester: Secondary | ICD-10-CM

## 2020-12-16 DIAGNOSIS — F319 Bipolar disorder, unspecified: Secondary | ICD-10-CM

## 2020-12-16 SURGERY — Surgical Case
Anesthesia: Regional

## 2020-12-16 NOTE — Progress Notes (Signed)
Attempted to call patient two times via phone number available in chart to determine if she would be coming to the hospital for her scheduled cesarean section. No answer from patient. Dr. Crissie Reese notified.

## 2020-12-20 ENCOUNTER — Other Ambulatory Visit: Payer: Self-pay

## 2020-12-20 ENCOUNTER — Encounter: Payer: Self-pay | Admitting: Obstetrics and Gynecology

## 2020-12-20 ENCOUNTER — Telehealth: Payer: Self-pay | Admitting: *Deleted

## 2020-12-20 ENCOUNTER — Ambulatory Visit (INDEPENDENT_AMBULATORY_CARE_PROVIDER_SITE_OTHER): Payer: Medicaid Other | Admitting: Obstetrics and Gynecology

## 2020-12-20 ENCOUNTER — Encounter: Payer: Medicaid Other | Admitting: Obstetrics and Gynecology

## 2020-12-20 VITALS — BP 129/73 | HR 73 | Wt 230.0 lb

## 2020-12-20 DIAGNOSIS — O99210 Obesity complicating pregnancy, unspecified trimester: Secondary | ICD-10-CM

## 2020-12-20 DIAGNOSIS — O99113 Other diseases of the blood and blood-forming organs and certain disorders involving the immune mechanism complicating pregnancy, third trimester: Secondary | ICD-10-CM

## 2020-12-20 DIAGNOSIS — O0993 Supervision of high risk pregnancy, unspecified, third trimester: Secondary | ICD-10-CM

## 2020-12-20 DIAGNOSIS — O0930 Supervision of pregnancy with insufficient antenatal care, unspecified trimester: Secondary | ICD-10-CM

## 2020-12-20 DIAGNOSIS — Z98891 History of uterine scar from previous surgery: Secondary | ICD-10-CM

## 2020-12-20 DIAGNOSIS — D696 Thrombocytopenia, unspecified: Secondary | ICD-10-CM

## 2020-12-20 NOTE — Progress Notes (Signed)
   PRENATAL VISIT NOTE  Subjective:  Holly Craig is a 30 y.o. [id number] at [redacted]w[redacted]d being seen today for ongoing prenatal care.  She is currently monitored for the following issues for this high-risk pregnancy and has History of severe pre-eclampsia; Schizoaffective disorder (HCC); Bipolar 1 disorder (HCC); History of cesarean delivery; Late prenatal care; Supervision of high risk pregnancy in third trimester; Insufficient prenatal care in third trimester; Benign gestational thrombocytopenia in third trimester (HCC); History of nephrectomy, left; Obesity in pregnancy; BMI 40.0-44.9, adult (HCC); and UTI (urinary tract infection) in pregnancy, antepartum, third trimester on their problem list.  Patient reports no complaints.  Contractions: Not present. Vag. Bleeding: None.  Movement: Present. Denies leaking of fluid.   The following portions of the patient's history were reviewed and updated as appropriate: allergies, current medications, past family history, past medical history, past social history, past surgical history and problem list.   Objective:   Vitals:   12/20/20 1049  BP: 129/73  Pulse: 73  Weight: 230 lb (104.3 kg)    Fetal Status: Fetal Heart Rate (bpm): 145   Movement: Present   FH 40  General:  Alert, oriented and cooperative. Patient is in no acute distress.  Skin: Skin is warm and dry. No rash noted.   Cardiovascular: Normal heart rate noted  Respiratory: Normal respiratory effort, no problems with respiration noted  Abdomen: Soft, gravid, appropriate for gestational age.  Pain/Pressure: Absent     Pelvic: Cervical exam deferred        Extremities: Normal range of motion.  Edema: Trace  Mental Status: Normal mood and affect. Normal behavior. Normal judgment and thought content.   Assessment and Plan:  Pregnancy: [id number] at [redacted]w[redacted]d 1. Benign gestational thrombocytopenia in third trimester (HCC) - Recheck platelets today - 2 weeks ago stable a 98  2. History of  cesarean delivery - Scheduled for next Saturday for RLTCS if no labor prior to then - she will not come in for c-section prior to this day. She desires vbac but 2 prior c-sections, so no induction. We discussed NST/BPP since going past EDC. She is amenable to this.   3. Late prenatal care  4. Supervision of high risk pregnancy in third trimester - Flu offered and recommended - pt accepts - f/u in one week  5. Obesity in pregnancy   Term labor symptoms and general obstetric precautions including but not limited to vaginal bleeding, contractions, leaking of fluid and fetal movement were reviewed in detail with the patient. Please refer to After Visit Summary for other counseling recommendations.   Return in about 1 week (around 12/27/2020) for OB VISIT, MD only, Needs BPP/NST monday.  Future Appointments  Date Time Provider Department Center  12/25/2020  9:55 AM Levie Heritage, DO [facility] None     Milas Hock, MD

## 2020-12-20 NOTE — Telephone Encounter (Signed)
Call to patient- husband answers and states he is listed on release and just left appointment with her- he has now left home. Advised c-section scheduled for 12-28-20 at 0930 and he reports patient does not want c-section. Advised this is scheduled one week after due date, just in case and he states "if nothing wrong, she will not keep appointment." Advised it is scheduled and will update physician.  Encounter closed.

## 2020-12-24 ENCOUNTER — Encounter (HOSPITAL_COMMUNITY): Payer: Self-pay | Admitting: Obstetrics & Gynecology

## 2020-12-24 ENCOUNTER — Telehealth (HOSPITAL_COMMUNITY): Payer: Self-pay | Admitting: *Deleted

## 2020-12-24 NOTE — Telephone Encounter (Signed)
Preadmission screen  

## 2020-12-25 ENCOUNTER — Encounter (HOSPITAL_COMMUNITY): Payer: Self-pay | Admitting: Obstetrics and Gynecology

## 2020-12-25 ENCOUNTER — Encounter (HOSPITAL_COMMUNITY): Payer: Self-pay | Admitting: Anesthesiology

## 2020-12-25 ENCOUNTER — Encounter: Payer: Medicaid Other | Admitting: Family Medicine

## 2020-12-25 ENCOUNTER — Inpatient Hospital Stay (HOSPITAL_COMMUNITY): Payer: Medicaid Other | Admitting: Anesthesiology

## 2020-12-25 ENCOUNTER — Ambulatory Visit: Payer: Medicaid Other | Admitting: Family Medicine

## 2020-12-25 ENCOUNTER — Inpatient Hospital Stay (HOSPITAL_COMMUNITY)
Admission: AD | Admit: 2020-12-25 | Discharge: 2020-12-27 | DRG: 806 | Disposition: A | Discharge status: Home / Self Care | Payer: Medicaid Other | Attending: Obstetrics and Gynecology | Admitting: Obstetrics and Gynecology

## 2020-12-25 ENCOUNTER — Other Ambulatory Visit: Payer: Self-pay

## 2020-12-25 DIAGNOSIS — O99892 Other specified diseases and conditions complicating childbirth: Secondary | ICD-10-CM | POA: Diagnosis present

## 2020-12-25 DIAGNOSIS — Z98891 History of uterine scar from previous surgery: Secondary | ICD-10-CM

## 2020-12-25 DIAGNOSIS — D696 Thrombocytopenia, unspecified: Secondary | ICD-10-CM | POA: Diagnosis present

## 2020-12-25 DIAGNOSIS — Z23 Encounter for immunization: Secondary | ICD-10-CM

## 2020-12-25 DIAGNOSIS — Z3A40 40 weeks gestation of pregnancy: Secondary | ICD-10-CM

## 2020-12-25 DIAGNOSIS — Z20822 Contact with and (suspected) exposure to covid-19: Secondary | ICD-10-CM | POA: Diagnosis present

## 2020-12-25 DIAGNOSIS — F250 Schizoaffective disorder, bipolar type: Secondary | ICD-10-CM

## 2020-12-25 DIAGNOSIS — F23 Brief psychotic disorder: Secondary | ICD-10-CM | POA: Diagnosis not present

## 2020-12-25 DIAGNOSIS — F259 Schizoaffective disorder, unspecified: Secondary | ICD-10-CM | POA: Diagnosis present

## 2020-12-25 DIAGNOSIS — O9912 Other diseases of the blood and blood-forming organs and certain disorders involving the immune mechanism complicating childbirth: Secondary | ICD-10-CM | POA: Diagnosis present

## 2020-12-25 DIAGNOSIS — R000 Tachycardia, unspecified: Secondary | ICD-10-CM | POA: Diagnosis present

## 2020-12-25 DIAGNOSIS — O4202 Full-term premature rupture of membranes, onset of labor within 24 hours of rupture: Secondary | ICD-10-CM

## 2020-12-25 DIAGNOSIS — O321XX0 Maternal care for breech presentation, not applicable or unspecified: Secondary | ICD-10-CM | POA: Diagnosis present

## 2020-12-25 DIAGNOSIS — O99214 Obesity complicating childbirth: Secondary | ICD-10-CM | POA: Diagnosis present

## 2020-12-25 DIAGNOSIS — O34219 Maternal care for unspecified type scar from previous cesarean delivery: Secondary | ICD-10-CM | POA: Diagnosis present

## 2020-12-25 DIAGNOSIS — D6959 Other secondary thrombocytopenia: Secondary | ICD-10-CM | POA: Diagnosis present

## 2020-12-25 DIAGNOSIS — O1490 Unspecified pre-eclampsia, unspecified trimester: Secondary | ICD-10-CM

## 2020-12-25 DIAGNOSIS — F319 Bipolar disorder, unspecified: Secondary | ICD-10-CM

## 2020-12-25 DIAGNOSIS — Z905 Acquired absence of kidney: Secondary | ICD-10-CM

## 2020-12-25 DIAGNOSIS — O0993 Supervision of high risk pregnancy, unspecified, third trimester: Secondary | ICD-10-CM

## 2020-12-25 DIAGNOSIS — O0930 Supervision of pregnancy with insufficient antenatal care, unspecified trimester: Secondary | ICD-10-CM

## 2020-12-25 DIAGNOSIS — Z8759 Personal history of other complications of pregnancy, childbirth and the puerperium: Secondary | ICD-10-CM

## 2020-12-25 DIAGNOSIS — O34211 Maternal care for low transverse scar from previous cesarean delivery: Secondary | ICD-10-CM

## 2020-12-25 HISTORY — DX: Gestational (pregnancy-induced) hypertension without significant proteinuria, unspecified trimester: O13.9

## 2020-12-25 LAB — COMPREHENSIVE METABOLIC PANEL
ALT: 18 U/L (ref 0–44)
AST: 23 U/L (ref 15–41)
Albumin: 2.9 g/dL — ABNORMAL LOW (ref 3.5–5.0)
Alkaline Phosphatase: 134 U/L — ABNORMAL HIGH (ref 38–126)
Anion gap: 8 (ref 5–15)
BUN: 9 mg/dL (ref 6–20)
CO2: 20 mmol/L — ABNORMAL LOW (ref 22–32)
Calcium: 9.4 mg/dL (ref 8.9–10.3)
Chloride: 108 mmol/L (ref 98–111)
Creatinine, Ser: 0.53 mg/dL (ref 0.44–1.00)
GFR, Estimated: >60 mL/min (ref >60)
Glucose, Bld: 120 mg/dL — ABNORMAL HIGH (ref 70–99)
Potassium: 4.1 mmol/L (ref 3.5–5.1)
Sodium: 136 mmol/L (ref 135–145)
Total Bilirubin: 1 mg/dL (ref 0.3–1.2)
Total Protein: 6.5 g/dL (ref 6.5–8.1)

## 2020-12-25 LAB — RESP PANEL BY RT-PCR (FLU A&B, COVID) ARPGX2
Influenza A by PCR: NEGATIVE
Influenza B by PCR: NEGATIVE
SARS Coronavirus 2 by RT PCR: NEGATIVE

## 2020-12-25 LAB — CBC
HCT: 36.1 % (ref 36.0–46.0)
HCT: 33.4 % — ABNORMAL LOW (ref 36.0–46.0)
Hemoglobin: 11.8 g/dL — ABNORMAL LOW (ref 12.0–15.0)
Hemoglobin: 11.3 g/dL — ABNORMAL LOW (ref 12.0–15.0)
MCH: 28 pg (ref 26.0–34.0)
MCH: 28.5 pg (ref 26.0–34.0)
MCHC: 32.7 g/dL (ref 30.0–36.0)
MCHC: 33.8 g/dL (ref 30.0–36.0)
MCV: 85.7 fL (ref 80.0–100.0)
MCV: 84.1 fL (ref 80.0–100.0)
Platelets: UNDETERMINED 10*3/uL (ref 150–400)
Platelets: 96 10*3/uL — ABNORMAL LOW (ref 150–400)
RBC: 4.21 MIL/uL (ref 3.87–5.11)
RBC: 3.97 MIL/uL (ref 3.87–5.11)
RDW: 14.2 % (ref 11.5–15.5)
RDW: 14.3 % (ref 11.5–15.5)
WBC: 9.3 10*3/uL (ref 4.0–10.5)
WBC: 14.8 10*3/uL — ABNORMAL HIGH (ref 4.0–10.5)
nRBC: 0 % (ref 0.0–0.2)
nRBC: 0 % (ref 0.0–0.2)

## 2020-12-25 LAB — RPR: RPR Ser Ql: NONREACTIVE

## 2020-12-25 LAB — RAPID URINE DRUG SCREEN, HOSP PERFORMED
Amphetamines: NOT DETECTED
Barbiturates: NOT DETECTED
Benzodiazepines: NOT DETECTED
Cocaine: NOT DETECTED
Opiates: NOT DETECTED
Tetrahydrocannabinol: NOT DETECTED

## 2020-12-25 LAB — PROTEIN / CREATININE RATIO, URINE
Creatinine, Urine: 176.27 mg/dL
Protein Creatinine Ratio: 8.37 mg/mg{Cre} — ABNORMAL HIGH (ref 0.00–0.15)
Total Protein, Urine: 1475 mg/dL

## 2020-12-25 LAB — TYPE AND SCREEN
ABO/RH(D): B POS
Antibody Screen: NEGATIVE

## 2020-12-25 LAB — PLATELET COUNT: Platelets: 82 10*3/uL — ABNORMAL LOW (ref 150–400)

## 2020-12-25 MED ORDER — OXYTOCIN BOLUS FROM INFUSION
333.0000 mL | Freq: Once | INTRAVENOUS | Status: AC
  Administered 2020-12-25: 333 mL via INTRAVENOUS

## 2020-12-25 MED ORDER — OXYTOCIN-SODIUM CHLORIDE 30-0.9 UT/500ML-% IV SOLN
2.5000 [IU]/h | INTRAVENOUS | Status: DC
  Administered 2020-12-25: 2.5 [IU]/h via INTRAVENOUS
  Filled 2020-12-25: qty 500

## 2020-12-25 MED ORDER — OXYCODONE-ACETAMINOPHEN 5-325 MG PO TABS: 2.0000 | ORAL_TABLET | ORAL | Status: DC | PRN

## 2020-12-25 MED ORDER — ZOLPIDEM TARTRATE 5 MG PO TABS
5.0000 mg | ORAL_TABLET | Freq: Every evening | ORAL | Status: DC | PRN
Start: 2020-12-25 — End: 2020-12-27

## 2020-12-25 MED ORDER — BENZOCAINE-MENTHOL 20-0.5 % EX AERO: 1.0000 "application " | INHALATION_SPRAY | CUTANEOUS | Status: DC | PRN

## 2020-12-25 MED ORDER — ACETAMINOPHEN 325 MG PO TABS: 650.0000 mg | ORAL_TABLET | ORAL | Status: DC | PRN

## 2020-12-25 MED ORDER — OXYTOCIN-SODIUM CHLORIDE 30-0.9 UT/500ML-% IV SOLN
1.0000 m[IU]/min | INTRAVENOUS | Status: DC
Start: 2020-12-25 — End: 2020-12-25
  Administered 2020-12-25: 2 m[IU]/min via INTRAVENOUS

## 2020-12-25 MED ORDER — LACTATED RINGERS IV SOLN: 500.0000 mL | Freq: Once | INTRAVENOUS | Status: DC

## 2020-12-25 MED ORDER — OXYCODONE-ACETAMINOPHEN 5-325 MG PO TABS: 1.0000 | ORAL_TABLET | ORAL | Status: DC | PRN

## 2020-12-25 MED ORDER — ONDANSETRON HCL 4 MG/2ML IJ SOLN: 4.0000 mg | Freq: Four times a day (QID) | INTRAMUSCULAR | Status: DC | PRN

## 2020-12-25 MED ORDER — LACTATED RINGERS IV SOLN
500.0000 mL | INTRAVENOUS | Status: DC | PRN
  Administered 2020-12-25: 1000 mL via INTRAVENOUS

## 2020-12-25 MED ORDER — SOD CITRATE-CITRIC ACID 500-334 MG/5ML PO SOLN
30.0000 mL | ORAL | Status: DC | PRN
  Filled 2020-12-25: qty 30

## 2020-12-25 MED ORDER — LIDOCAINE HCL (PF) 1 % IJ SOLN
INTRAMUSCULAR | Status: DC | PRN
  Administered 2020-12-25 (×2): 5 mL via EPIDURAL

## 2020-12-25 MED ORDER — LACTATED RINGERS IV SOLN: INTRAVENOUS | Status: DC

## 2020-12-25 MED ORDER — FENTANYL-BUPIVACAINE-NACL 0.5-0.125-0.9 MG/250ML-% EP SOLN
12.0000 mL/h | EPIDURAL | Status: DC | PRN
Start: 2020-12-25 — End: 2020-12-25
  Administered 2020-12-25: 12 mL/h via EPIDURAL
  Filled 2020-12-25: qty 250

## 2020-12-25 MED ORDER — PRENATAL MULTIVITAMIN CH
1.0000 | ORAL_TABLET | Freq: Every day | ORAL | Status: DC
  Administered 2020-12-26: 1 via ORAL
  Filled 2020-12-25: qty 1

## 2020-12-25 MED ORDER — EPHEDRINE 5 MG/ML INJ: 10.0000 mg | INTRAVENOUS | Status: DC | PRN

## 2020-12-25 MED ORDER — FENTANYL CITRATE (PF) 100 MCG/2ML IJ SOLN
INTRAMUSCULAR | Status: AC
  Filled 2020-12-25: qty 2

## 2020-12-25 MED ORDER — SENNOSIDES-DOCUSATE SODIUM 8.6-50 MG PO TABS
2.0000 | ORAL_TABLET | ORAL | Status: DC
  Administered 2020-12-26: 2 via ORAL
  Filled 2020-12-25: qty 2

## 2020-12-25 MED ORDER — TERBUTALINE SULFATE 1 MG/ML IJ SOLN: 0.2500 mg | Freq: Once | INTRAMUSCULAR | Status: DC | PRN

## 2020-12-25 MED ORDER — ONDANSETRON HCL 4 MG PO TABS: 4.0000 mg | ORAL_TABLET | ORAL | Status: DC | PRN

## 2020-12-25 MED ORDER — FENTANYL CITRATE (PF) 100 MCG/2ML IJ SOLN
100.0000 ug | INTRAMUSCULAR | Status: DC | PRN
  Administered 2020-12-25: 100 ug via INTRAVENOUS

## 2020-12-25 MED ORDER — SIMETHICONE 80 MG PO CHEW: 80.0000 mg | CHEWABLE_TABLET | ORAL | Status: DC | PRN

## 2020-12-25 MED ORDER — PHENYLEPHRINE 40 MCG/ML (10ML) SYRINGE FOR IV PUSH (FOR BLOOD PRESSURE SUPPORT)
80.0000 ug | PREFILLED_SYRINGE | INTRAVENOUS | Status: DC | PRN
  Filled 2020-12-25: qty 10

## 2020-12-25 MED ORDER — MEASLES, MUMPS & RUBELLA VAC IJ SOLR: 0.5000 mL | Freq: Once | INTRAMUSCULAR | Status: DC

## 2020-12-25 MED ORDER — TETANUS-DIPHTH-ACELL PERTUSSIS 5-2.5-18.5 LF-MCG/0.5 IM SUSY
0.5000 mL | PREFILLED_SYRINGE | Freq: Once | INTRAMUSCULAR | Status: AC
  Administered 2020-12-26: 0.5 mL via INTRAMUSCULAR
  Filled 2020-12-25: qty 0.5

## 2020-12-25 MED ORDER — LIDOCAINE HCL (PF) 1 % IJ SOLN: 30.0000 mL | INTRAMUSCULAR | Status: DC | PRN

## 2020-12-25 MED ORDER — ONDANSETRON HCL 4 MG/2ML IJ SOLN
4.0000 mg | INTRAMUSCULAR | Status: DC | PRN
Start: 2020-12-25 — End: 2020-12-27

## 2020-12-25 MED ORDER — WITCH HAZEL-GLYCERIN EX PADS: 1.0000 "application " | MEDICATED_PAD | CUTANEOUS | Status: DC | PRN

## 2020-12-25 MED ORDER — ACETAMINOPHEN 325 MG PO TABS
650.0000 mg | ORAL_TABLET | ORAL | Status: DC | PRN
  Administered 2020-12-26 (×3): 650 mg via ORAL
  Filled 2020-12-25 (×3): qty 2

## 2020-12-25 MED ORDER — DIPHENHYDRAMINE HCL 25 MG PO CAPS: 25.0000 mg | ORAL_CAPSULE | Freq: Four times a day (QID) | ORAL | Status: DC | PRN

## 2020-12-25 MED ORDER — COCONUT OIL OIL: 1.0000 "application " | TOPICAL_OIL | Status: DC | PRN

## 2020-12-25 MED ORDER — DIPHENHYDRAMINE HCL 50 MG/ML IJ SOLN
12.5000 mg | INTRAMUSCULAR | Status: DC | PRN
Start: 2020-12-25 — End: 2020-12-25
  Filled 2020-12-25: qty 1

## 2020-12-25 MED ORDER — DIPHENHYDRAMINE HCL 50 MG/ML IJ SOLN
25.0000 mg | Freq: Once | INTRAMUSCULAR | Status: AC
  Administered 2020-12-25: 25 mg via INTRAVENOUS

## 2020-12-25 MED ORDER — PHENYLEPHRINE 40 MCG/ML (10ML) SYRINGE FOR IV PUSH (FOR BLOOD PRESSURE SUPPORT)
80.0000 ug | PREFILLED_SYRINGE | INTRAVENOUS | Status: DC | PRN
Start: 2020-12-25 — End: 2020-12-25

## 2020-12-25 MED ORDER — OXYCODONE HCL 5 MG PO TABS: 5.0000 mg | ORAL_TABLET | ORAL | Status: DC | PRN

## 2020-12-25 MED ORDER — DIBUCAINE (PERIANAL) 1 % EX OINT
1.0000 | TOPICAL_OINTMENT | CUTANEOUS | Status: DC | PRN
Start: 2020-12-25 — End: 2020-12-27

## 2020-12-25 NOTE — Progress Notes (Signed)
Patient ID: Holly Craig, female   DOB: [birth year], 30 y.o.   MRN: [id number]  Labor Progress Note Catlynn Grondahl is a 30 y.o. [id number] at [redacted]w[redacted]d presented for SROM with contractions. Has a history of gestational thrombocytopenia (plts 82) and 2 prior CS separated by 2 VBAC. 1st was for pre-e with pulmonary edema, 2nd was for "arrest of labor due to malposition".  S:  Pt resting in bed on right side, FOB at bedside. Pt has very flat affect, stiff positioning and only gives 1-2 word answers to all questions. Complaining of pain in her vagina with contractions.  O:  BP (!) 122/98   Pulse (!) 110   Temp 98.3 F (36.8 C) (Oral)   Resp 16   Ht 5\' 1"  (1.549 m)   Wt 220 lb 7.4 oz (100 kg)   SpO2 100%   BMI 41.66 kg/m  EFM: baseline 150 bpm/moderate variability/ 10x10 accels/no decels  Toco/IUPC: q56min, IUPC placed during exam SVE: Dilation: 5.5 Effacement (%): 50 (swollen lip on right side, paper thin on the left) Station: -1/0 Presentation: Vertex Exam by:: Leana Springston,CNM Pitocin: 46mu/min  A/P: 30 y.o. 38 [redacted]w[redacted]d  1. Labor: Active labor, baby has descended more and cervix less swollen. Still has thick lip on right side of cervix, left side is paper thin and stretchy, moderate amount of bloody show present. Repositioned to left side with peanut ball. 2. FWB: Cat 2. Minimal variability and occasional variables, but overall reassuring. MDs aware. 3. Pain: well-controlled with epidural  4. BP: Stable, RN reported that earlier severe range pressures were during contractions and patient was moving.  5. Infection: pt feels warm (98.9) and mildly tachycardic (has had two boluses today), will monitor for additional signs of infection if delivery not soon.  Cautiously anticipate SVD.  [redacted]w[redacted]d, CNM, MSN, IBCLC Certified Nurse Midwife, [facility]

## 2020-12-25 NOTE — Lactation Note (Signed)
This note was copied from a baby's chart. Lactation Consultation Note  Patient Name: Holly Craig EVOJJ'K Date: 12/25/2020 Reason for consult: L&D Initial assessment;Term Age:31 hours LC entered room, mom was doing skin to skin with infant. Infant was cuing to breastfeed, mom latched infant on her right breast using the football hold position, infant latched with depth and still breastfeeding after 15 minutes when LC left the room. Mom knows to ask RN/LC for latch assistance if needed on MBU. Mom knows to breastfeed infant according to feeding cues, 8 to 12+ or more times within 24 hours, skin to skin.  Mom would like to have hand pump for prn home use.  Maternal Data Has patient been taught Hand Expression?: Yes Does the patient have breastfeeding experience prior to this delivery?: Yes How long did the patient breastfeed?: first child one month, 2nd & 3rd one year and 4 th child 59 months who is currently 99 years old.  Feeding Mother's Current Feeding Choice: Breast Milk and Formula  LATCH Score Latch: Grasps breast easily, tongue down, lips flanged, rhythmical sucking.  Audible Swallowing: Spontaneous and intermittent  Type of Nipple: Everted at rest and after stimulation  Comfort (Breast/Nipple): Soft / non-tender  Hold (Positioning): Full assist, staff holds infant at breast  LATCH Score: 8   Lactation Tools Discussed/Used    Interventions Interventions: Breast feeding basics reviewed;Skin to skin;Breast compression;Adjust position;Support pillows;Position options;Expressed milk;Education  Discharge WIC Program: Yes  Consult Status Consult Status: Follow-up Date: 12/26/20 Follow-up type: In-patient    Danelle Earthly 12/25/2020, 9:23 PM

## 2020-12-25 NOTE — Discharge Summary (Addendum)
Postpartum Discharge Summary     Patient Name: Holly Craig DOB: Jul 24, 1990 MRN: [id number]  Date of admission: 12/25/2020 Delivery date:12/25/2020  Delivering provider: Wells Guiles  Date of discharge: 12/27/2020  Admitting diagnosis: Indication for care in labor and delivery, antepartum [O75.9] Intrauterine pregnancy: [redacted]w[redacted]d     Secondary diagnosis:  Active Problems:   History of severe pre-eclampsia   Schizoaffective disorder ([place])   History of cesarean delivery   Late prenatal care   Benign gestational thrombocytopenia in third trimester [facility])   History of nephrectomy, left   Preeclampsia   Vaginal birth after cesarean delivery  Additional problems: none    Discharge diagnosis: Term Pregnancy Delivered and VBAC                                              Post partum procedures: none Augmentation: Pitocin Complications: None  Hospital course: Onset of Labor With Vaginal Delivery      30 y.o. yo [id number] at [redacted]w[redacted]d was admitted in Latent Labor with SROM on 12/25/2020. Patient had an uncomplicated labor course as follows, including receiving Pitocin for augmentation. Her platelets on admission were 98 and then dropped to 82 prior to epidural placement.  She had some elevated BPs with ctx prior to epidural placement with neg pre-e bloodwork (contaminated P/C).  Membrane Rupture Time/Date: 7:11 AM ,12/25/2020   Delivery Method:VBAC, Spontaneous  Episiotomy: None  Lacerations:  None  Patient had an uncomplicated postpartum course. Her platelets were 89 on PPD#2. She had a psych consult prior to d/c in order to establish mental health care. She is ambulating, tolerating a regular diet, passing flatus, and urinating well. Patient is discharged home in stable condition on 12/27/20.  Newborn Data: Birth date:12/25/2020  Birth time:8:19 PM  Gender:Female  Living status:Living  Apgars:8 ,9  Weight:3504 g (7lb 11.6oz)  Magnesium Sulfate received: No BMZ received:  No Rhophylac:N/A MMR:N/A T-DaP: offered postpartum Flu: No Transfusion:No  Physical exam  Vitals:   12/26/20 1222 12/26/20 1431 12/26/20 2028 12/27/20 0521  BP: 107/77 119/61 117/73 126/73  Pulse: 100 95 90 87  Resp: $Remo'16 16 16 16  'Zrudq$ Temp: 98 F (36.7 C) 98.1 F (36.7 C) 98.6 F (37 C) 98.4 F (36.9 C)  TempSrc: Oral Oral Oral Oral  SpO2:   100% 99%  Weight:      Height:       General: alert, cooperative, and no distress Lochia: appropriate Uterine Fundus: firm Incision: N/A DVT Evaluation: No evidence of DVT seen on physical exam. Labs: Lab Results  Component Value Date   WBC 14.8 (H) 12/25/2020   HGB 11.3 (L) 12/25/2020   HCT 33.4 (L) 12/25/2020   MCV 84.1 12/25/2020   PLT 89 (L) 12/27/2020   CMP Latest Ref Rng & Units 12/25/2020  Glucose 70 - 99 mg/dL 120(H)  BUN 6 - 20 mg/dL 9  Creatinine 0.44 - 1.00 mg/dL 0.53  Sodium 135 - 145 mmol/L 136  Potassium 3.5 - 5.1 mmol/L 4.1  Chloride 98 - 111 mmol/L 108  CO2 22 - 32 mmol/L 20(L)  Calcium 8.9 - 10.3 mg/dL 9.4  Total Protein 6.5 - 8.1 g/dL 6.5  Total Bilirubin 0.3 - 1.2 mg/dL 1.0  Alkaline Phos 38 - 126 U/L 134(H)  AST 15 - 41 U/L 23  ALT 0 - 44 U/L 18   Edinburgh Score: Edinburgh Postnatal Depression Scale Screening  Tool 12/26/2020  I have been able to laugh and see the funny side of things. 1  I have looked forward with enjoyment to things. 1  I have blamed myself unnecessarily when things went wrong. 0  I have been anxious or worried for no good reason. 0  I have felt scared or panicky for no good reason. 0  Things have been getting on top of me. 0  I have been so unhappy that I have had difficulty sleeping. 0  I have felt sad or miserable. 0  I have been so unhappy that I have been crying. 0  The thought of harming myself has occurred to me. 0  Edinburgh Postnatal Depression Scale Total 2     After visit meds:  Allergies as of 12/27/2020       Reactions   Ibuprofen    Avoid due to kidney issues          Medication List     TAKE these medications    acetaminophen 325 MG tablet Commonly known as: Tylenol Take 2 tablets (650 mg total) by mouth every 4 (four) hours as needed (for pain scale < 4). Maximum dose of acetaminophen in 4000 mg from all sources in 24 hours.   PRENATAL PO Take 1 tablet by mouth daily.         Discharge home in stable condition Infant Feeding: Breast Infant Disposition:home with mother Discharge instruction: per After Visit Summary and Postpartum booklet. Activity: Advance as tolerated. Pelvic rest for 6 weeks.  Diet: routine diet Future Appointments: Future Appointments  Date Time Provider [place]  01/01/2021  3:50 PM Anyanwu, Sallyanne Havers, MD [facility] None  02/07/2021 10:15 AM Nehemiah Settle Tanna Savoy, DO [facility] None   Follow up Visit:  [place], [place] Follow up on 01/10/2021.   Why: for mood and BP check Contact information: [address] [place] [address] [place] Follow up in 1 week(s).   Specialty: Obstetrics and Gynecology Why: for mood check Contact information: [place] [address] [phone]               Myrtis Ser, CNM  P [facility] Admin Please schedule this patient for Postpartum visit in: 4 weeks with the following provider: Any provider  In-Person  For C/S patients schedule nurse incision check in weeks 2 weeks: no  High risk pregnancy complicated by: prev C/S x 2 with VBAC; hx pre-e  Delivery mode:  VBAC  Anticipated Birth Control:  declines  PP Procedures needed: BP check and mental health check in 1-2wks PP  Schedule Integrated [place] visit: yes  Wells Guiles, DO 12/27/2020, 9:29 AM PGY-1, [place]

## 2020-12-25 NOTE — H&P (Signed)
OBSTETRIC ADMISSION HISTORY AND PHYSICAL  Holly Craig is a 30 y.o. female [id number] with IUP at [redacted]w[redacted]d presenting for TOLAC. She reports +FMs, +LOF, no VB, no blurry vision, headaches or peripheral edema, and RUQ pain.  She plans on breast and formula feeding. She declines birth control. She received her prenatal care at  [facility]    Dating: By clinical EDD --->  Estimated Date of Delivery: 12/21/20  Sono:  @[redacted]w[redacted]d , not all anatomy well visualized, no abnormal anatomy noted, breech presentation, posterior placenta, 2886g, 48% EFW  Prenatal History/Complications: limited prenatal care  Past Medical History: Past Medical History:  Diagnosis Date   Anemia    Bipolar 1 disorder (HCC)    Pregnancy induced hypertension    Schizoaffective disorder (HCC)    Thrombocytopenia during pregnancy (HCC)     Past Surgical History: Past Surgical History:  Procedure Laterality Date   CESAREAN SECTION     x2   NEPHRECTOMY Left 2014   She states she had pain in this area and they said it was damaged; removed in 2014.    Obstetrical History: OB History     Gravida  10   Para  4   Term  4   Preterm      AB  5   Living  4      SAB  5   IAB      Ectopic      Multiple      Live Births  4           Social History Social History   Socioeconomic History   Marital status: Married    Spouse name: Not on file   Number of children: Not on file   Years of education: Not on file   Highest education level: Not on file  Occupational History   Not on file  Tobacco Use   Smoking status: Never   Smokeless tobacco: Never  Vaping Use   Vaping Use: Never used  Substance and Sexual Activity   Alcohol use: Not Currently   Drug use: Never   Sexual activity: Yes    Birth control/protection: None  Other Topics Concern   Not on file  Social History Narrative   Not on file   Social Determinants of Health   Financial Resource Strain: Not on file  Food Insecurity: Not on file   Transportation Needs: Not on file  Physical Activity: Not on file  Stress: Not on file  Social Connections: Not on file    Family History: Family History  Problem Relation Age of Onset   Cancer Neg Hx    Diabetes Neg Hx    Hypertension Neg Hx     Allergies: Allergies  Allergen Reactions   Ibuprofen     Avoid due to kidney issues     Medications Prior to Admission  Medication Sig Dispense Refill Last Dose   Prenatal Vit-Fe Fumarate-FA (PRENATAL PO) Take 1 tablet by mouth daily.   12/24/2020     Review of Systems   All systems reviewed and negative except as stated in HPI  Blood pressure (!) 141/85, pulse 91, temperature 98 F (36.7 C), temperature source Oral, resp. rate 16, height 5\' 1"  (1.549 m), weight 100 kg, SpO2 100 %. General appearance: alert and cooperative Lungs: clear to auscultation bilaterally Heart: regular rate and rhythm Abdomen: soft, non-tender; bowel sounds normal Extremities: Homans sign is negative, no sign of DVT Presentation: cephalic Fetal monitoring: baseline 150s, minimal variability, -accels, -decels Uterine  activityFrequency: Every 3-4 minutes Dilation: 4 Effacement (%): 70 Station: -2 Exam by:: Dicky Doe, RN  Prenatal labs: ABO, Rh: --/--/B POS (10/19 6546) Antibody: NEG (10/19 5035) Rubella: 31.10 (09/09 1111) RPR: Non Reactive (09/09 1111)  HBsAg: Negative (09/09 1111)  HIV: Non Reactive (09/09 1111)  GBS: Negative/-- (09/09 1104)  3rd trimester 2 hr GTT: passed Genetic screening: None Anatomy US: limited visualization, no abnormals listed  Prenatal Transfer Tool  Maternal Diabetes: No Genetic Screening: Declined Maternal Ultrasounds/Referrals: Normal, some anatomy not visualized Fetal Ultrasounds or other Referrals:  None Maternal Substance Abuse:  No Significant Maternal Medications:  None Significant Maternal Lab Results: Group B Strep negative  Results for orders placed or performed during the hospital  encounter of 12/25/20 (from the past 24 hour(s))  Comprehensive metabolic panel   Collection Time: 12/25/20  9:28 AM  Result Value Ref Range   Sodium 136 135 - 145 mmol/L   Potassium 4.1 3.5 - 5.1 mmol/L   Chloride 108 98 - 111 mmol/L   CO2 20 (L) 22 - 32 mmol/L   Glucose, Bld 120 (H) 70 - 99 mg/dL   BUN 9 6 - 20 mg/dL   Creatinine, Ser 4.65 0.44 - 1.00 mg/dL   Calcium 9.4 8.9 - 68.1 mg/dL   Total Protein 6.5 6.5 - 8.1 g/dL   Albumin 2.9 (L) 3.5 - 5.0 g/dL   AST 23 15 - 41 U/L   ALT 18 0 - 44 U/L   Alkaline Phosphatase 134 (H) 38 - 126 U/L   Total Bilirubin 1.0 0.3 - 1.2 mg/dL   GFR, Estimated >27 >51 mL/min   Anion gap 8 5 - 15  Type and screen [facility]   Collection Time: 12/25/20  9:28 AM  Result Value Ref Range   ABO/RH(D) B POS    Antibody Screen NEG    Sample Expiration      12/28/2020,2359 Performed at [facility], [address] [place], [place] [address]   Resp Panel by RT-PCR (Flu A&B, Covid) Nasopharyngeal Swab   Collection Time: 12/25/20  9:43 AM   Specimen: Nasopharyngeal Swab; Nasopharyngeal(NP) swabs in vial transport medium  Result Value Ref Range   SARS Coronavirus 2 by RT PCR NEGATIVE NEGATIVE   Influenza A by PCR NEGATIVE NEGATIVE   Influenza B by PCR NEGATIVE NEGATIVE    Patient Active Problem List   Diagnosis Date Noted   Indication for care in labor and delivery, antepartum 12/25/2020   UTI (urinary tract infection) in pregnancy, antepartum, third trimester 11/22/2020   Benign gestational thrombocytopenia in third trimester (HCC) 11/18/2020   History of nephrectomy, 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   Obesity in pregnancy 11/18/2020   BMI 40.0-44.9, adult (HCC) 11/18/2020   Late prenatal care 11/15/2020   Supervision of high risk pregnancy in third trimester 11/15/2020   Insufficient prenatal care in third trimester 11/15/2020   History of severe pre-eclampsia 10/27/2020   Schizoaffective disorder (HCC) 10/27/2020   Bipolar 1 disorder  (HCC) 10/27/2020   History of cesarean delivery 10/27/2020    Assessment/Plan:  Holly Craig is a 30 y.o. [id number] at [redacted]w[redacted]d here for SOL requesting TOLAC.   #Labor: Latent phase. SOL/ROM. Expectant management. TOLAC precautions reviewed w/ patient.  #Pain: 7/10. Fentanyl order placed. Platelet count pending for epidural as patient is interested in epidural. #Fetal Well Being: Category I #ID: GBS neg #Method Of Feeding: breast and formula feeding #Method Of Contraception: no method #Circ:  N/A #Gestational thrombocytopenia: platelet count clumped. Reorder  platelet count. Not to receive epidural until proper count received.   Shelby Mattocks, DO 12/25/2020, 10:42 AM PGY-1,  Family Medicine

## 2020-12-25 NOTE — Anesthesia Preprocedure Evaluation (Addendum)
Anesthesia Evaluation  Patient identified by MRN, date of birth, ID band Patient awake    Reviewed: Allergy & Precautions, NPO status , Patient's Chart, lab work & pertinent test results  Airway Mallampati: II  TM Distance: >3 FB Neck ROM: Full    Dental no notable dental hx. (+) Dental Advisory Given   Pulmonary neg pulmonary ROS,    Pulmonary exam normal        Cardiovascular hypertension, Normal cardiovascular exam     Neuro/Psych PSYCHIATRIC DISORDERS Bipolar Disorder Schizophrenia negative neurological ROS     GI/Hepatic negative GI ROS, Neg liver ROS,   Endo/Other  Morbid obesity  Renal/GU negative Renal ROS  negative genitourinary   Musculoskeletal negative musculoskeletal ROS (+)   Abdominal   Peds negative pediatric ROS (+)  Hematology  (+) Blood dyscrasia, anemia , Thrombocytopenia during pregnancy    Anesthesia Other Findings   Reproductive/Obstetrics (+) Pregnancy                            Anesthesia Physical Anesthesia Plan  ASA: 3  Anesthesia Plan: Epidural   Post-op Pain Management:    Induction:   PONV Risk Score and Plan:   Airway Management Planned:   Additional Equipment:   Intra-op Plan:   Post-operative Plan:   Informed Consent: I have reviewed the patients History and Physical, chart, labs and discussed the procedure including the risks, benefits and alternatives for the proposed anesthesia with the patient or authorized representative who has indicated his/her understanding and acceptance.     Dental advisory given  Plan Discussed with: Anesthesiologist  Anesthesia Plan Comments:         Anesthesia Quick Evaluation

## 2020-12-25 NOTE — MAU Note (Signed)
SROM at 0711, clear fluid. Endorses good fetal movement. Plans TOLAC. Denies VB.

## 2020-12-25 NOTE — Progress Notes (Signed)
Epidural not charted on LDA avatar. Epidural is clean dry intact with gauze dressing on assessment. CBC orders placed post delivery plts at 96. Dr. Clemens Catholic with anesthesia notified and is ok'd epidural to be removed. Epidural removed at 2250 catheter tip intact site clean dry and intact. MBU RN made aware.

## 2020-12-25 NOTE — Progress Notes (Signed)
Patient ID: Holly Craig, female   DOB: Apr 12, 1990, 30 y.o.   MRN: [id number]  Labor Progress Note Holly Craig is a 30 y.o. [id number] at [redacted]w[redacted]d presented for SROM with contractions. Has a history of gestational thrombocytopenia (plts 82) and 2 prior CS separated by 2 VBAC. 1st was for pre-e with pulmonary edema, 2nd was for "arrest of labor due to malposition".  S:  Pt resting in bed after epidural placement, FOB at bedside. Pt has very flat affect, stiff positioning and only gives 1-2 word answers to all questions.  O:  BP (!) 122/98   Pulse (!) 110   Temp 98.3 F (36.8 C) (Oral)   Resp 16   Ht 5\' 1"  (1.549 m)   Wt 220 lb 7.4 oz (100 kg)   SpO2 100%   BMI 41.66 kg/m  EFM: baseline 150 bpm/moderate variability/ 15x15 accels/no decels  Toco/IUPC: irregular and hard to trace SVE: Dilation: 5.5 Effacement (%): 50 Station: -2 Presentation: Vertex Exam by:: Ebrahim Deremer,CNM Pitocin: none  A/P: 30 y.o. 38 [redacted]w[redacted]d  1. Labor: Latent to active, cervix very swollen on the right side and pointed to the left so right lip is felt anteriorly. MAU RN reported that the cervix was not puffy feeling in MAU, soft and pliable. When rechecked less than an hour later, able to stretch cervix to 5.5 but now swollen so initial check is 4/50/-2. IV benadryl ordered and RN to start pitocin titration along with position changes. 2. FWB: Cat 1 now, was more Cat 2 with minimal variability and few accels, has improved since epidural placement 3. Pain: well-controlled with epidural  4. BP: Two severe range pressures prior to epidural, did not return once epidural in place. Remain intermittently elevated (130-140s/70s-90s) so will get PEC labs. UDS ordered by Dr. [redacted]w[redacted]d r/t maternal tachycardia without fever and affect.  Cautiously anticipate SVD.  Ashok Pall, CNM, MSN, IBCLC Certified Nurse Midwife, [facility]

## 2020-12-26 ENCOUNTER — Encounter (HOSPITAL_COMMUNITY): Admission: RE | Admit: 2020-12-26 | Payer: Medicaid Other | Source: Ambulatory Visit

## 2020-12-26 DIAGNOSIS — O34211 Maternal care for low transverse scar from previous cesarean delivery: Secondary | ICD-10-CM | POA: Diagnosis not present

## 2020-12-26 DIAGNOSIS — O4202 Full-term premature rupture of membranes, onset of labor within 24 hours of rupture: Secondary | ICD-10-CM | POA: Diagnosis not present

## 2020-12-26 DIAGNOSIS — Z3A40 40 weeks gestation of pregnancy: Secondary | ICD-10-CM | POA: Diagnosis not present

## 2020-12-26 DIAGNOSIS — F23 Brief psychotic disorder: Secondary | ICD-10-CM | POA: Diagnosis not present

## 2020-12-26 MED ORDER — INFLUENZA VAC SPLIT QUAD 0.5 ML IM SUSY
0.5000 mL | PREFILLED_SYRINGE | INTRAMUSCULAR | Status: AC
  Administered 2020-12-27: 0.5 mL via INTRAMUSCULAR
  Filled 2020-12-26: qty 0.5

## 2020-12-26 NOTE — Consult Note (Addendum)
[facility] New Psychiatric Evaluation   Service Date: December 26, 2020 LOS:  LOS: 1 day    Assessment  Holly Craig is a 30 y.o. female admitted medically for 12/25/2020  9:02 AM for childbirth. 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    Her current presentation of resolution of psychotic symptoms after treatment with haldol (now off meds for a few months) is most consistent with treated psychosis. It is noted that [place] essentially left [place] was noted to be detached and have a flat affect by primary team; husband is not concerned by this (and seems to be in keeping w/ pt's cultural background).   Current outpatient psychotropic medications include nothing and historically she has had a good response to haloperidol. On initial examination, patient was engaged with exam, oriented, and noted to be very caring to her infant (breastfed for majority of exam). We discussed return precautions, need for sleep protection, and pros and cons of watchful waiting She declined the offer of an interpreter. Please see plan below for detailed recommendations.   Diagnoses:  Active Hospital problems: Active Problems:   History of severe pre-eclampsia   Schizoaffective disorder (HCC)   History of cesarean delivery   Late prenatal care   Benign gestational thrombocytopenia in third trimester [facility])   History of nephrectomy, left    Problems edited/added by me: No problems updated.  Plan  ## Safety and Observation Level:  - Based on my clinical evaluation, I estimate the patient to be at low risk of self harm in the current setting - At this time, we recommend a routine level of observation. This decision is based on my review of the chart including patient's  history and current presentation, interview of the patient, mental status examination, and consideration of suicide risk including evaluating suicidal ideation, plan, intent, suicidal or self-harm behaviors, risk factors, and protective factors. This judgment is based on our ability to directly address suicide risk, implement suicide prevention strategies and develop a safety plan while the patient is in the clinical setting. Please contact our team if there is a concern that risk level has changed.  ## Medications:  -- none at present time -- will revisit haloperidol 10 mg tomorrow AM  ## Medical Decision Making Capacity:  Not formally assessed  ## Disposition:  -- needs mood check within ~10 days of birth, may need to be through [facility] office if psych f/u cannot be found in appropriate time frame -- otherwise, plugged in with [facility] and has been given info about LME  I discussed case with [facility] matters which was quite helpful   ## Behavioral / Environmental:  -- needs EPDS prior to d/c will inform future recs  Thank you for this consult request. Recommendations have been communicated to the primary team.  We will continue to follow at this time.   [place] A Kienna Moncada    NEW history  Relevant Aspects of Hospital Course:  Admitted on 12/25/2020 for VBAC with pre-eclampsia.  Patient Report:  History is largely supplied by pt with husband at bedside offering some additional info.   Discussed pt's breastfeeding journey, hopes for new baby - has breastfed older children successfully. Discussed prior brushes with psychiatry - states "I was having anxiety and  they gave me medication and it was helpful". When asked specifically about symptoms, does endorse worries about demons, hallucinations etc found in Care Everywhere. She and husband use "anxiety" throughout interview to describe pt's psychotic episode. She has not had any similar symptoms since about  February  which husband corroborates.  She had worsening sleep around the time she had these symptoms, but otherwise did not endorse sx c/w a manic episode (which would increase postpartum risk).  She got a shot (haldol decanoate) through June to help with these symptoms; had no side effects. This was her first psychotic episode. Today she is in a good mood with no worries. Husband clarifies that she never had HI toward this infant (or true HI) but was afraid something bad would happen to one of their older children.   We discussed likely diagnosis (brief psychotic episode) and pt's treatment course (~5 months of antipsychotics with full resolution). I discussed high-risk nature of postpartum period and need for sleep protection extensively with pt and husband. I offered haloperidol due to this being a high risk period; pt and husband will think on it and decide as a family by tomorrow. I discussed return precautions with pt and husband including any return of "anxiety" in addition to more typical return precautions  ROS:  No complaints voiced  Collateral information:  Husband at bedside  Psychiatric History:  Information collected from pt, husband  1 episode of psychosis <1 month fully resolved  Family psych history: unknown  Medical History: Past Medical History:  Diagnosis Date   Anemia    Bipolar 1 disorder ([place])    Pregnancy induced hypertension    Schizoaffective disorder ([place])    Thrombocytopenia during pregnancy ([place])     Surgical History: Past Surgical History:  Procedure Laterality Date   CESAREAN SECTION     x2   NEPHRECTOMY Left 2014   She states she had pain in this area and they said it was damaged; removed in 2014.    Medications:   Current Facility-Administered Medications:    acetaminophen (TYLENOL) tablet 650 mg, 650 mg, Oral, Q4H PRN, Myrtis Ser, CNM, 650 mg at 12/26/20 1229   benzocaine-Menthol (DERMOPLAST) 20-0.5 % topical spray 1 application, 1  application, Topical, PRN, Myrtis Ser, CNM   coconut oil, 1 application, Topical, PRN, Myrtis Ser, CNM   witch hazel-glycerin (TUCKS) pad 1 application, 1 application, Topical, PRN **AND** dibucaine (NUPERCAINAL) 1 % rectal ointment 1 application, 1 application, Rectal, PRN, Myrtis Ser, CNM   diphenhydrAMINE (BENADRYL) capsule 25 mg, 25 mg, Oral, Q6H PRN, Myrtis Ser, CNM   [START ON 12/27/2020] influenza vac split quadrivalent PF (FLUARIX) injection 0.5 mL, 0.5 mL, Intramuscular, Tomorrow-1000, Wouk, Ailene Rud, MD   measles, mumps & rubella vaccine (MMR) injection 0.5 mL, 0.5 mL, Subcutaneous, Once, Serita Grammes D, CNM   ondansetron [facility]) tablet 4 mg, 4 mg, Oral, Q4H PRN **OR** ondansetron (ZOFRAN) injection 4 mg, 4 mg, Intravenous, Q4H PRN, Serita Grammes D, CNM   oxyCODONE (Oxy IR/ROXICODONE) immediate release tablet 5 mg, 5 mg, Oral, Q4H PRN, Serita Grammes D, CNM   prenatal multivitamin tablet 1 tablet, 1 tablet, Oral, Q1200, Myrtis Ser, CNM, 1 tablet at 12/26/20 1221   senna-docusate (Senokot-S) tablet 2 tablet, 2 tablet, Oral, Q24H, Serita Grammes D, CNM   simethicone (MYLICON) chewable tablet 80 mg, 80 mg, Oral, PRN, Serita Grammes D, CNM   zolpidem (AMBIEN) tablet 5 mg, 5 mg, Oral, QHS PRN, Myrtis Ser,  CNM  Allergies: Allergies  Allergen Reactions   Ibuprofen     Avoid due to kidney issues     Social History:    Tobacco use: denied Alcohol use: denied Drug use: denied, also denied herbal supplements  Family History:  The patient's family history is not on file.    Objective  Vital signs:  Temp:  [97.7 F (36.5 C)-99.7 F (37.6 C)] 98.1 F (36.7 C) (10/20 1431) Pulse Rate:  [95-130] 95 (10/20 1431) Resp:  [16-18] 16 (10/20 1431) BP: (100-152)/(61-106) 119/61 (10/20 1431) SpO2:  [98 %-100 %] 100 % (10/20 0740)  Physical Exam: Gen: normocephalic/atraumatic Pulm: no increased WOB Head: normocephalic/atraumatic Psych: alert  and oriented   Mental Status Exam: Appearance: Appropriate in hospital attire, breastfeeding baby  Attitude:  Reserved, cooperate  Behavior/Psychomotor: No slowing noted, no increased rate of gesturing  Speech/Language:  Clear, coherent, occasionally slow (when searching for words)  Mood: Good, happy  Affect: Blunted   Thought process: Clear, coherent, goal directed "I want to take good care of my baby"  Thought content:   Devoid of SI, HI, paranoia, delusions  Perceptual disturbances:  None endorsed, not RIS  Attention: good  Concentration: good  Orientation: full  Memory: Recent/remote intact  Fund of knowledge:  Not formally assessed  Insight:   fair  Judgment:  fair  Impulse Control: good

## 2020-12-26 NOTE — Progress Notes (Addendum)
Interim Progress Note  I went to speak with patient after discussing concern for baby and mother by social worker given patient's psychiatric history (bipolar I disorder, schizoaffective disorder) that is not currently being medically managed nor plugged in with psychiatry since moving here in July from [place]. She states she was on medications for anxiety but stopped them between May-July because "I don't have anxiety". This contradicts previous discussion with patient during admission when she stated she has not been on any medications other than prenatal vitamin during entirety of pregnancy. Pt actively breastfeeding with father of baby in room. Denies anxiousness, feeling down or hopeless, and SI/HI.   I've spoken with her nurse who indicates pt is feeding baby frequently and both pt and father of baby are active in care. Denies any red flags from her point of view. Psychiatry consult discussed with Dr. Gasper Sells who advised EKG and will be making contact with patient later today. I've discussed with patient that given previous psychiatric care, I would like to involve psychiatry to ensure her care is properly supported with the necessary resources in addition to ordering EKG. Pt states she has never before received an EKG. I discussed the procedure with her. Pt did not have any further questions regarding psychiatry consult or EKG. When prompted about counseling resources, pt adamantly declined.  Her affect is flat and she minimizes eye contact during discussion similar to prior encounters. Answers are brief and she appears guarded when prompted about psychiatric history.  Psychiatry to see this afternoon. Appreciate recommendations.  Shelby Mattocks, DO 12/26/2020, 11:59 AM PGY-1, [facility]

## 2020-12-26 NOTE — Anesthesia Postprocedure Evaluation (Signed)
Anesthesia Post Note  Patient: Ellianne Baccari  Procedure(s) Performed: AN AD HOC LABOR EPIDURAL     Patient location during evaluation: Mother Baby Anesthesia Type: Epidural Level of consciousness: awake and alert Pain management: pain level controlled Vital Signs Assessment: post-procedure vital signs reviewed and stable Respiratory status: spontaneous breathing, nonlabored ventilation and respiratory function stable Cardiovascular status: stable Postop Assessment: no headache, no backache and epidural receding Anesthetic complications: no   No notable events documented.  Last Vitals:  Vitals:   12/26/20 0426 12/26/20 0740  BP: 125/67 113/65  Pulse: (!) 109 100  Resp: 18 16  Temp: 37 C 36.5 C  SpO2: 98% 100%    Last Pain:  Vitals:   12/26/20 0740  TempSrc: Oral  PainSc:    Pain Goal:                   Woodard Perrell

## 2020-12-26 NOTE — Clinical Social Work Maternal (Addendum)
CLINICAL SOCIAL WORK MATERNAL/CHILD NOTE  Patient Details  Name: Holly Craig MRN: [id number] Date of Birth: Aug 18, 1990  Date:  12/26/2020  Clinical Social Worker Initiating Note:  Kathrin Greathouse, [place] Date/Time: Initiated:  12/26/20/1230     Child's Name:  Holly Craig   Biological Parents:  Mother, Father Maree Erie 02-19-1987)   Need for Interpreter:  None   Reason for Referral:  Behavioral Health Concerns   Address:  [address] [place] [address]    Phone number:  [phone] (home)     Additional phone number:   Household Members/Support Persons (HM/SP):   Household Member/Support Person 1, Household Member/Support Person 2, Household Member/Support Person 3, Household Member/Support Person 4, Household Member/Support Person 5   HM/SP Name Relationship DOB or Age  HM/SP -[place] 02-19-1987  HM/SP -[place] Daughter 10  HM/SP -3 Kandace Blitz Daughter 6  HM/SP -[place] Daughter 5  HM/SP -5 Meshach Nduwayo Son 3  HM/SP -6        HM/SP -7        HM/SP -8          Natural Supports (not living in the home):  Extended Family   Professional Supports: None   Employment: Unemployed   Type of Work:     Education:  Programmer, systems   Homebound arranged:    Museum/gallery curator Resources:  [employer]   Other Resources:  [employer], Physicist, medical     Cultural/Religious Considerations Which May Impact Care:    Strengths:  Ability to meet basic needs  , Home prepared for child     Psychotropic Medications:         Pediatrician:       Pediatrician List:   [place]      Pediatrician Fax Number:    Risk Factors/Current Problems:  Mental Health Concerns     Cognitive State:  Alert  , Linear Thinking     Mood/Affect:  Calm  , Flat  , Smiling at times  CSW Assessment: CSW Assessment: CSW received consult for  hx of Bipolar, Schizoaffective, and need for psychiatric consult. CSW met with MOB to offer support and complete assessment.    Per chart review, it noted MOB received [facility] services at [facility] [place]. MOB was hospitalized earlier this year for behavioral health. MOB was taking psychotropic medications (Abilify, Lithium, Olanzapine) and received Haldol IM. The Risk analyst working with MOB attempted to arrange services with [facility] program however the agency director felt it was too far from [place] to be helpful. Per [facility] records, it was recommended MOB receive a psychiatric consult prior to discharge. CSW discussed the need for a psychiatric consult with the nurse. The nurse and CSW reached out to Dr. Wells Guiles to request psychiatric consult.   CSW met with MOB at bedside and introduced CSW role. CSW observed MOB lying in bed and FOB present at bedside. MOB presented calm and agreeable to CSW visit. MOB welcomed FOB to stay present during the assessment. MOB confirmed the demographic information on file was correct and shared she lives with her spouse and children (see chart above). MOB acknowledged FOB, brother-in-law, and sister-in-law (live in the area) as supports. MOB shared she is adjusting to the area however she does not prefer [place] the [place]. CSW inquired how MOB has felt since giving birth. MOB expressed she is feeling ok, she is still having pain in her back and legs. CSW encouraged MOB to notify the nurse when she is experiencing pain. MOB reported understanding. CSW inquired how MOB felt emotionally during the pregnancy. MOB reported she felt good during the pregnancy. CSW inquired about MOB mental health history. MOB stated, "I have anxiety." MOB denied history bipolar, schizoaffective disorder and depression. Then MOB stated, "I used to have those diagnosis but not now." CSW inquired if MOB received medication  for mental health. MOB reported she was given medication, but it made her feel sick, so she stopped taking the medication. CSW inquired about MOB hospitalizations, MOB reported she did not remember the hospitalizations. FOB said "yes" to the listing of diagnosis for MOB, bipolar, schizoaffective disorder depression and anxiety. FOB stated MOB was hospitalized in October 2021-November 2021 and then again in January 2022-March 2022 because she was "acting weird and seeing things." FOB reported MOB received the Haldol injection treatment in May. FOB reported MOB has not established mental health services since relocating to [facility] because she reports feeling fine. CSW discussed [place] services and offered to make a referral for mental health services. FOB stated he prefers to wait to see how MOB does in the coming weeks. CSW strongly encouraged FOB to consider establishing services so that MOB can continue receiving mental health treatment. CSW provided FOB with [facility] contact information. FOB reported understanding. CSW inquired if MOB knew of PPD and if she experienced PPD. MOB was not knowledgeable of PPD. CSW provided education regarding the baby blues period vs. perinatal mood disorders, discussed treatment options and gave resources for mental health follow up if concerns arise. CSW recommended MOB complete a self-evaluation during the postpartum time period using the New Mom Checklist from Postpartum Progress and encouraged MOB to contact a medical professional if symptoms are noted at any time. MOB and FOB reported understanding. CSW assessed MOB for safety. MOB denied thoughts of harm to self and others.   CSW inquired if MOB has essential items for the infant. FOB reported MOB was connected with a community program in [place] that was going to provide car seat, a place for the infant to sleep and diapers and wipes. So, they do not have items for the infant. CSW informed MOB/FOB that CSW can assist  with providing car seat and pack n play for the infant to safely sleep. CSW educated MOB/FOB about [employer]. MOB gave CSW permission to make the referral. CSW provided review of Sudden Infant Death Syndrome (SIDS) precautions. MOB reported she receives both WIC/FS benefits. FOB they are still deciding on a pediatrician. FOB confirmed they have transportation to appointments. CSW assessed MOB and FOB for additional needs. MOB reported no further needs.   -CSW made a referral to [employer], provided pack n play and a car seat  -CSW identifies no further need for intervention and no barriers to discharge at this time.  CSW Plan/Description:  Sudden Infant Death Syndrome (SIDS) Education, Other Information/Referral to [employer], Perinatal Mood and Anxiety Disorder (PMADs) Education, No Further Intervention Required/No Barriers to Discharge, Neonatal Abstinence Syndrome (NAS) Education    Lia Hopping, LCSW 12/26/2020, 3:45PM

## 2020-12-26 NOTE — Progress Notes (Signed)
Post Partum Day 1 Subjective: no complaints, up ad lib, voiding, tolerating PO, and + flatus  Objective: Blood pressure 113/65, pulse 100, temperature 97.7 F (36.5 C), temperature source Oral, resp. rate 16, height 5\' 1"  (1.549 m), weight 100 kg, SpO2 100 %, unknown if currently breastfeeding.  Physical Exam:  General: alert and no distress Lochia: appropriate Uterine Fundus: firm DVT Evaluation: No evidence of DVT seen on physical exam.  Recent Labs    12/25/20 0928 12/25/20 2145  HGB 11.8* 11.3*  HCT 36.1 33.4*    Assessment/Plan: Holly Craig is a 30 y.o. 38 on PPD 1. She is recovering well and will discharge tomorrow. She is planning on breastfeeding and formula feeding, and planning on using condoms for contraception.  - Follow up CBC on 10/21 for thrombocytopenia     LOS: 1 day   11/21 12/26/2020, 9:47 AM   Patient ID: 12/28/2020, female   DOB: Oct 01, 1990, 30 y.o.   MRN: [id number]

## 2020-12-26 NOTE — Lactation Note (Signed)
This note was copied from a baby's chart. Lactation Consultation Note  Patient Name: Holly Craig LSLHT'D Date: 12/26/2020 Reason for consult: Follow-up assessment Age:31 hours  P5, Mother is breastfeeding and formula feeding. Reviewed milk storage guidelines and provided mother with manual pump. Baby cueing.  Mother latched baby with ease. Encouraged breastfeeding before offering formula to help establish milk supply.  Discussed supply and demand. Mom made aware of O/P services, breastfeeding support groups, community resources, and our phone # for post-discharge questions.    Maternal Data Has patient been taught Hand Expression?: Yes Does the patient have breastfeeding experience prior to this delivery?: Yes  Feeding Mother's Current Feeding Choice: Breast Milk and Formula  LATCH Score Latch: Grasps breast easily, tongue down, lips flanged, rhythmical sucking.  Audible Swallowing: A few with stimulation  Type of Nipple: Everted at rest and after stimulation  Comfort (Breast/Nipple): Soft / non-tender  Hold (Positioning): No assistance needed to correctly position infant at breast.  LATCH Score: 9   Lactation Tools Discussed/Used  Manual pump   Interventions Interventions: Breast feeding basics reviewed;Hand pump;Education;LC Services brochure   Consult Status Consult Status: Follow-up Date: 12/27/20 Follow-up type: In-patient    Dahlia Byes [facility] 12/26/2020, 8:36 AM

## 2020-12-27 DIAGNOSIS — O1490 Unspecified pre-eclampsia, unspecified trimester: Secondary | ICD-10-CM

## 2020-12-27 DIAGNOSIS — O34211 Maternal care for low transverse scar from previous cesarean delivery: Secondary | ICD-10-CM | POA: Diagnosis not present

## 2020-12-27 DIAGNOSIS — O34219 Maternal care for unspecified type scar from previous cesarean delivery: Secondary | ICD-10-CM

## 2020-12-27 DIAGNOSIS — Z3A40 40 weeks gestation of pregnancy: Secondary | ICD-10-CM | POA: Diagnosis not present

## 2020-12-27 DIAGNOSIS — F23 Brief psychotic disorder: Secondary | ICD-10-CM | POA: Diagnosis not present

## 2020-12-27 DIAGNOSIS — O4202 Full-term premature rupture of membranes, onset of labor within 24 hours of rupture: Secondary | ICD-10-CM | POA: Diagnosis not present

## 2020-12-27 LAB — PLATELET COUNT: Platelets: 89 10*3/uL — ABNORMAL LOW (ref 150–400)

## 2020-12-27 MED ORDER — ACETAMINOPHEN 325 MG PO TABS: 650.0000 mg | ORAL_TABLET | ORAL | Status: AC | PRN

## 2020-12-27 NOTE — Lactation Note (Signed)
This note was copied from a baby's chart. Lactation Consultation Note  Patient Name: Holly Craig YBRKV'T Date: 12/27/2020 Reason for consult: Follow-up assessment;Term Age:31 hours  P5 mother whose infant is now 24 hours old.  This is a term baby at 40+4 weeks.  Mother has breast feeding experience will all of her other children.  Mother's current feeding preference is breast/formula.  Baby was swaddled and in the visitor's arms when I arrived.  Mother had no questions/concerns related to breast feeding.  LATCH scores 8-9; voiding/stooling.  Mother is a [facility] participant in [place].  She has a manual pump for home use.  Father and visitors present. Family has our OP phone number for any questions after discharge. Family has been discharged.    Maternal Data    Feeding Mother's Current Feeding Choice: Breast Milk and Formula  LATCH Score                    Lactation Tools Discussed/Used    Interventions Interventions: Education  Discharge Discharge Education: Engorgement and breast care  Consult Status Consult Status: Complete Date: 12/27/20 Follow-up type: In-patient    Tekela Garguilo R Nika Yazzie 12/27/2020, 11:38 AM

## 2020-12-27 NOTE — Consult Note (Signed)
[facility] New Psychiatric Evaluation   Service Date: December 27, 2020 LOS:  LOS: 2 days    Assessment  Holly Craig is a 30 y.o. female admitted medically for 12/25/2020  9:02 AM for childbirth. 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    Her current presentation of resolution of psychotic symptoms after treatment with haldol (now off meds for a few months) is most consistent with treated psychosis. It is noted that [place] essentially [place] was noted to be detached and have a flat affect by primary team; husband is not concerned by this (and seems to be in keeping w/ pt's cultural background).   Current outpatient psychotropic medications include nothing and historically she has had a good response to haloperidol. On initial examination, patient was engaged with exam, oriented, and noted to be very caring to her infant (breastfed for majority of exam). We discussed return precautions, need for sleep protection, and pros and cons of watchful waiting She declined the offer of an interpreter. Please see plan below for detailed recommendations.   10/21: PT appears similar to yesterday and continues to deny "anxiety" symptoms. Continues to prefer expectant management. Discussed return precautions more extensively with pt and husband including return of "anxiety" symptoms, SI/HI/etc, also discussed sleep protection. They do not own guns. EPDS of 2, OB doing quick mood check within ~10 days of delivery, psych resources to be provided at discharge,   Diagnoses:  Active Hospital problems: Active Problems:   History of severe pre-eclampsia   Schizoaffective disorder (HCC)   History of cesarean delivery   Late prenatal care   Benign gestational  thrombocytopenia in third trimester [facility])   History of nephrectomy, left   Preeclampsia   Vaginal birth after cesarean delivery    Problems edited/added by me: No problems updated.  Plan  ## Safety and Observation Level:  - Based on my clinical evaluation, I estimate the patient to be at low risk of self harm in the current setting - At this time, we recommend a routine level of observation. This decision is based on my review of the chart including patient's history and current presentation, interview of the patient, mental status examination, and consideration of suicide risk including evaluating suicidal ideation, plan, intent, suicidal or self-harm behaviors, risk factors, and protective factors. This judgment is based on our ability to directly address suicide risk, implement suicide prevention strategies and develop a safety plan while the patient is in the clinical setting. Please contact our team if there is a concern that risk level has changed.  ## Medications:  -- none at present time  ## Medical Decision Making Capacity:  Not formally assessed  ## Disposition:  -- needs mood check within ~10 days of birth, may need to be through [facility] office if psych f/u cannot be found in appropriate time frame -- otherwise, plugged in with [facility] and has been given info about LME  I discussed case with Arthur matters which was quite helpful   ## Behavioral / Environmental:  -- EPDS 2  Thank you for this consult request. Recommendations have been communicated to the primary team.  We will continue to follow at this time.   [place] A Shazia Mitchener    NEW history  Relevant Aspects of Hospital Course:  Admitted on 12/25/2020 for VBAC with pre-eclampsia.  Patient Report:  Patient continues to deny any "anxiety" (previously took form of hallucinations, disorganization, etc); husband corroborates this. Does not want antipsychotic medicine at this time.  Looking forward to going home with baby and eating home cooked food - thinks it will help her milk come in and help her relax more.   Denies SI, HI, AH/VH. Slept poorly (newborn). Discussed need for overnight help after dc.   ROS:  No complaints voiced  Collateral information:  Husband at bedside  Psychiatric History:  Information collected from pt, husband  1 episode of psychosis <1 month fully resolved  Family psych history: unknown  Medical History: Past Medical History:  Diagnosis Date   Anemia    Bipolar 1 disorder ([place])    Pregnancy induced hypertension    Schizoaffective disorder ([place])    Thrombocytopenia during pregnancy ([place])     Surgical History: Past Surgical History:  Procedure Laterality Date   CESAREAN SECTION     x2   NEPHRECTOMY Left 2014   She states she had pain in this area and they said it was damaged; removed in 2014.    Medications:   Current Facility-Administered Medications:    acetaminophen (TYLENOL) tablet 650 mg, 650 mg, Oral, Q4H PRN, Myrtis Ser, CNM, 650 mg at 12/26/20 2331   benzocaine-Menthol (DERMOPLAST) 20-0.5 % topical spray 1 application, 1 application, Topical, PRN, Myrtis Ser, CNM   coconut oil, 1 application, Topical, PRN, Myrtis Ser, CNM   witch hazel-glycerin (TUCKS) pad 1 application, 1 application, Topical, PRN **AND** dibucaine (NUPERCAINAL) 1 % rectal ointment 1 application, 1 application, Rectal, PRN, Myrtis Ser, CNM   diphenhydrAMINE (BENADRYL) capsule 25 mg, 25 mg, Oral, Q6H PRN, Serita Grammes D, CNM   measles, mumps & rubella vaccine (MMR) injection 0.5 mL, 0.5 mL, Subcutaneous, Once, Serita Grammes D, CNM   ondansetron [facility]) tablet 4 mg, 4 mg, Oral, Q4H PRN **OR** ondansetron (ZOFRAN) injection 4 mg, 4 mg, Intravenous, Q4H PRN, Serita Grammes D, CNM   oxyCODONE (Oxy IR/ROXICODONE) immediate release tablet 5 mg, 5 mg, Oral, Q4H PRN, Serita Grammes D, CNM   prenatal multivitamin tablet 1 tablet,  1 tablet, Oral, Q1200, Myrtis Ser, CNM, 1 tablet at 12/26/20 1221   senna-docusate (Senokot-S) tablet 2 tablet, 2 tablet, Oral, Q24H, Myrtis Ser, CNM, 2 tablet at 12/26/20 2331   simethicone (MYLICON) chewable tablet 80 mg, 80 mg, Oral, PRN, Myrtis Ser, CNM   zolpidem (AMBIEN) tablet 5 mg, 5 mg, Oral, QHS PRN, Myrtis Ser, CNM  Current Outpatient Medications:    Prenatal Vit-Fe Fumarate-FA (PRENATAL PO), Take 1 tablet by mouth daily., Disp: , Rfl:    acetaminophen (TYLENOL) 325 MG tablet, Take 2 tablets (650 mg total) by mouth every 4 (four) hours as needed (for pain scale < 4). Maximum dose of acetaminophen in 4000 mg from all sources in 24 hours., Disp: , Rfl:   Allergies: Allergies  Allergen Reactions   Ibuprofen     Avoid due to kidney issues     Social History:    Tobacco use: denied Alcohol use: denied Drug use: denied, also denied herbal supplements  Family History:  The patient's family history is not on file.    Objective  Vital signs:  Temp:  [98.1 F (36.7 C)-98.6 F (37 C)] 98.4 F (36.9 C) (10/21 0521) Pulse Rate:  [87-95] 87 (10/21 0521) Resp:  [16] 16 (  10/21 0521) BP: (117-126)/(61-73) 126/73 (10/21 0521) SpO2:  [99 %-100 %] 99 % (10/21 0521)  Physical Exam: Gen: normocephalic/atraumatic Pulm: no increased WOB Head: normocephalic/atraumatic Psych: alert and oriented   Mental Status Exam: Appearance: Appropriate in hospital attire, breastfeeding baby  Attitude:  Reserved, cooperate  Behavior/Psychomotor: No slowing noted, no increased rate of gesturing  Speech/Language:  Clear, coherent, occasionally slow (when searching for words)  Mood: "Looking forward to going home"  Affect: Blunted   Thought process: Clear, coherent, goal directed  Thought content:   Devoid of SI, HI, paranoia, delusions  Perceptual disturbances:  None endorsed, not RIS  Attention: good  Concentration: good  Orientation: full  Memory: Recent/remote  intact  Fund of knowledge:  Not formally assessed  Insight:   fair  Judgment:  fair  Impulse Control: good

## 2020-12-28 ENCOUNTER — Encounter (HOSPITAL_COMMUNITY): Admission: RE | Payer: Self-pay | Source: Home / Self Care

## 2020-12-28 ENCOUNTER — Inpatient Hospital Stay (HOSPITAL_COMMUNITY)
Admission: RE | Admit: 2020-12-28 | Payer: Medicaid Other | Source: Home / Self Care | Admitting: Obstetrics & Gynecology

## 2020-12-28 DIAGNOSIS — Z98891 History of uterine scar from previous surgery: Secondary | ICD-10-CM

## 2020-12-28 DIAGNOSIS — F319 Bipolar disorder, unspecified: Secondary | ICD-10-CM

## 2020-12-28 DIAGNOSIS — Z8759 Personal history of other complications of pregnancy, childbirth and the puerperium: Secondary | ICD-10-CM

## 2020-12-28 DIAGNOSIS — F250 Schizoaffective disorder, bipolar type: Secondary | ICD-10-CM

## 2020-12-28 DIAGNOSIS — O0993 Supervision of high risk pregnancy, unspecified, third trimester: Secondary | ICD-10-CM

## 2020-12-28 HISTORY — DX: Thrombocytopenia, unspecified: D69.6

## 2020-12-28 HISTORY — DX: Thrombocytopenia, unspecified: O99.119

## 2020-12-28 SURGERY — Surgical Case
Anesthesia: Regional

## 2021-01-01 ENCOUNTER — Encounter: Payer: Medicaid Other | Admitting: Family Medicine

## 2021-01-01 ENCOUNTER — Ambulatory Visit: Payer: Medicaid Other | Admitting: Obstetrics & Gynecology

## 2021-01-07 ENCOUNTER — Telehealth (HOSPITAL_COMMUNITY): Payer: Self-pay

## 2021-01-07 NOTE — Telephone Encounter (Signed)
"  I'm good." Patient declines any questions or concerns about her healing.  "Baby is doing good. Eating and gaining weight. Baby sleeps in a crib." RN reviewed ABC's of safe sleep with patient. Patient declines any questions or concerns about baby.  EPDS score is 0.  Marcelino Duster [facility] 01/07/2021,1426

## 2021-01-09 ENCOUNTER — Ambulatory Visit: Payer: Medicaid Other

## 2021-01-10 ENCOUNTER — Ambulatory Visit: Payer: Medicaid Other | Admitting: Obstetrics and Gynecology

## 2021-01-23 ENCOUNTER — Ambulatory Visit: Payer: Medicaid Other | Admitting: Family Medicine

## 2021-01-29 ENCOUNTER — Emergency Department (HOSPITAL_COMMUNITY)
Admission: EM | Admit: 2021-01-29 | Discharge: 2021-01-31 | Disposition: A | Discharge status: Other Acute Inpatient Hospital | Payer: Medicaid Other | Attending: Emergency Medicine | Admitting: Emergency Medicine

## 2021-01-29 ENCOUNTER — Emergency Department (HOSPITAL_COMMUNITY): Payer: Medicaid Other

## 2021-01-29 ENCOUNTER — Ambulatory Visit: Payer: Medicaid Other | Admitting: Family Medicine

## 2021-01-29 ENCOUNTER — Other Ambulatory Visit: Payer: Self-pay

## 2021-01-29 DIAGNOSIS — F29 Unspecified psychosis not due to a substance or known physiological condition: Secondary | ICD-10-CM | POA: Insufficient documentation

## 2021-01-29 DIAGNOSIS — R4182 Altered mental status, unspecified: Secondary | ICD-10-CM | POA: Diagnosis present

## 2021-01-29 DIAGNOSIS — F419 Anxiety disorder, unspecified: Secondary | ICD-10-CM | POA: Diagnosis not present

## 2021-01-29 DIAGNOSIS — F329 Major depressive disorder, single episode, unspecified: Secondary | ICD-10-CM | POA: Diagnosis not present

## 2021-01-29 DIAGNOSIS — Z20822 Contact with and (suspected) exposure to covid-19: Secondary | ICD-10-CM | POA: Insufficient documentation

## 2021-01-29 DIAGNOSIS — N390 Urinary tract infection, site not specified: Secondary | ICD-10-CM

## 2021-01-29 LAB — CBC
HCT: 39.6 % (ref 36.0–46.0)
Hemoglobin: 13 g/dL (ref 12.0–15.0)
MCH: 27.7 pg (ref 26.0–34.0)
MCHC: 32.8 g/dL (ref 30.0–36.0)
MCV: 84.3 fL (ref 80.0–100.0)
Platelets: 119 10*3/uL — ABNORMAL LOW (ref 150–400)
RBC: 4.7 MIL/uL (ref 3.87–5.11)
RDW: 14.7 % (ref 11.5–15.5)
WBC: 8.3 10*3/uL (ref 4.0–10.5)
nRBC: 0 % (ref 0.0–0.2)

## 2021-01-29 LAB — URINALYSIS, ROUTINE W REFLEX MICROSCOPIC
Bilirubin Urine: NEGATIVE
Glucose, UA: NEGATIVE mg/dL
Hgb urine dipstick: NEGATIVE
Ketones, ur: NEGATIVE mg/dL
Nitrite: NEGATIVE
Protein, ur: 100 mg/dL — AB
Specific Gravity, Urine: 1.017 (ref 1.005–1.030)
WBC, UA: >50 WBC/hpf — ABNORMAL HIGH (ref 0–5)
pH: 5 (ref 5.0–8.0)

## 2021-01-29 LAB — COMPREHENSIVE METABOLIC PANEL
ALT: 25 U/L (ref 0–44)
AST: 29 U/L (ref 15–41)
Albumin: 3.9 g/dL (ref 3.5–5.0)
Alkaline Phosphatase: 124 U/L (ref 38–126)
Anion gap: 11 (ref 5–15)
BUN: 27 mg/dL — ABNORMAL HIGH (ref 6–20)
CO2: 21 mmol/L — ABNORMAL LOW (ref 22–32)
Calcium: 9.9 mg/dL (ref 8.9–10.3)
Chloride: 107 mmol/L (ref 98–111)
Creatinine, Ser: 1.01 mg/dL — ABNORMAL HIGH (ref 0.44–1.00)
GFR, Estimated: >60 mL/min (ref >60)
Glucose, Bld: 121 mg/dL — ABNORMAL HIGH (ref 70–99)
Potassium: 3.7 mmol/L (ref 3.5–5.1)
Sodium: 139 mmol/L (ref 135–145)
Total Bilirubin: 1.4 mg/dL — ABNORMAL HIGH (ref 0.3–1.2)
Total Protein: 7.7 g/dL (ref 6.5–8.1)

## 2021-01-29 LAB — HCG, QUANTITATIVE, PREGNANCY: hCG, Beta Chain, Quant, S: <1 m[IU]/mL (ref <5)

## 2021-01-29 LAB — ETHANOL: Alcohol, Ethyl (B): <10 mg/dL (ref <10)

## 2021-01-29 LAB — TSH: TSH: 3.966 u[IU]/mL (ref 0.350–4.500)

## 2021-01-29 LAB — RESP PANEL BY RT-PCR (FLU A&B, COVID) ARPGX2
Influenza A by PCR: NEGATIVE
Influenza B by PCR: NEGATIVE
SARS Coronavirus 2 by RT PCR: NEGATIVE

## 2021-01-29 LAB — RAPID URINE DRUG SCREEN, HOSP PERFORMED
Amphetamines: NOT DETECTED
Barbiturates: NOT DETECTED
Benzodiazepines: NOT DETECTED
Cocaine: NOT DETECTED
Opiates: NOT DETECTED
Tetrahydrocannabinol: NOT DETECTED

## 2021-01-29 LAB — SALICYLATE LEVEL: Salicylate Lvl: <7 mg/dL — ABNORMAL LOW (ref 7.0–30.0)

## 2021-01-29 MED ORDER — CEPHALEXIN 250 MG PO CAPS
250.0000 mg | ORAL_CAPSULE | Freq: Four times a day (QID) | ORAL | Status: DC
  Administered 2021-01-29 – 2021-01-31 (×9): 250 mg via ORAL
  Filled 2021-01-29 (×9): qty 1

## 2021-01-29 MED ORDER — ZIPRASIDONE MESYLATE 20 MG IM SOLR: 20.0000 mg | INTRAMUSCULAR | Status: DC | PRN

## 2021-01-29 MED ORDER — LORAZEPAM 1 MG PO TABS
1.0000 mg | ORAL_TABLET | ORAL | Status: AC | PRN
  Administered 2021-01-29: 1 mg via ORAL
  Filled 2021-01-29: qty 1

## 2021-01-29 MED ORDER — LORAZEPAM 1 MG PO TABS: 2.0000 mg | ORAL_TABLET | Freq: Two times a day (BID) | ORAL | Status: DC | PRN

## 2021-01-29 MED ORDER — HALOPERIDOL LACTATE 5 MG/ML IJ SOLN: 5.0000 mg | Freq: Two times a day (BID) | INTRAMUSCULAR | Status: DC

## 2021-01-29 MED ORDER — LORAZEPAM 2 MG/ML IJ SOLN
2.0000 mg | Freq: Once | INTRAMUSCULAR | Status: AC
  Administered 2021-01-29: 2 mg via INTRAVENOUS
  Filled 2021-01-29: qty 1

## 2021-01-29 MED ORDER — SODIUM CHLORIDE 0.9 % IV BOLUS
1000.0000 mL | Freq: Once | INTRAVENOUS | Status: AC
  Administered 2021-01-29: 1000 mL via INTRAVENOUS

## 2021-01-29 MED ORDER — OLANZAPINE 5 MG PO TBDP
5.0000 mg | ORAL_TABLET | Freq: Three times a day (TID) | ORAL | Status: DC | PRN
  Administered 2021-01-29: 5 mg via ORAL
  Filled 2021-01-29: qty 1

## 2021-01-29 MED ORDER — LORAZEPAM 2 MG/ML IJ SOLN: 2.0000 mg | Freq: Two times a day (BID) | INTRAMUSCULAR | Status: DC | PRN

## 2021-01-29 MED ORDER — CEPHALEXIN 250 MG PO CAPS: 250.0000 mg | ORAL_CAPSULE | Freq: Once | ORAL | Status: DC

## 2021-01-29 MED ORDER — HALOPERIDOL 5 MG PO TABS
5.0000 mg | ORAL_TABLET | Freq: Two times a day (BID) | ORAL | Status: DC
  Administered 2021-01-29 – 2021-01-30 (×3): 5 mg via ORAL
  Filled 2021-01-29 (×4): qty 1

## 2021-01-29 NOTE — Consult Note (Signed)
This nurse practitioner has attempted to reassess patient x2 since admission to the emergency room.  Patient remains asleep at this time.  Considering patient has not slept for 2 days, currently experiencing psychosis, paranoid ideations, and hallucinations will recommend allowing patient to sleep.  In the interim I have spoken with her husband [name], and obtain collateral.  As per husband he states his wife has been psychiatrically stable until last night.  He reports 3 previous hospitalizations in [place] in which patient required prolonged length of stay due to psychosis, mania, and noncompliance with medication.  He said she was subsequently trialed on Haldol oral initially, prior to converting to Haldol injection in which she was stabilized and successful on.  He states she took the Haldol from January 2022 until June 2022, in which they left [place] and moved to [place].  He states since moving to [place] they have had a hard time reestablishing of services and she has not taken any medications since then.  She is able to provide some events leading up to this hospitalization which include "she did not sleep well on Monday night, did not eat anything yesterday.  And then last night and noticed she was not normal or not acting like herself.  She was very calm and did not want any sleep. Needed to get her some help which is when I called for EMS.  "  This nurse practitioner provided psychoeducation, risks versus benefits information to the husband as it pertains to ongoing psychosis, lactation, and treatment for such.  Her husband appears to verbalize understanding, and states he would like to proceed with Haldol, and continue breast-feeding.  He also understands it is necessary for patient to be admitted to inpatient psychiatric behavioral health facility for crisis stabilization, medication management.  He appears to have some insight regarding his wife's mental illness.  He reports  they are currently using condoms as a method of contraception.  He also states the children are home with him, and are safe and appear to be doing well at this time.  He further reports that he has to go to work from 3-12 30 this evening, however he expects to receive a break at 5:45 PM and 8:45 PM.  He is working to phone call and updates during those times.  He is unable to locate the proper best pump supplies, and states mother has been solely nursing since delivery of the baby and will need assistance in getting no supplies for her.  This nurse practitioner did contact [facility], in order to coordinate supplies and delivery from the hospital.  This writer spoke with [place], [facility] who states she will send all supplies over as well as storage bottles via in the courier service.    Chart review from [place] [facility] on her last discharge in February 2022 :PT was sent directly to [facility] from outpatient clinic ([facility], Dr. 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 Patient was discharged from inpatient psychiatry unit in November of 2021, and while she was compliant with follow up appointments she has not been adherent to medication regimen (Abilify, lithium, and olanzapine) since shortly after the time of discharge.She was started on haloperidol 03/11/2019. It is unclear if pt has been taking this, although unlikely given her statement on admission that she does not take the psychiatric medication because it "makes (her) sick". She was placed on suicide precautions  with in-person sitter. She refused aripiprazole and never required olanzapine prn. She thinks she can cope with the voices on her own and does not need medications.  -Suspect much psychoeducation, risks versus benefits, and education regarding importance of medication will be required and will need to take place, as patient has a history of  being noncompliant, and has not been open to medication as evident by recent note from October 2022 in which Haldol was recommended to prevent postpartum psychosis and/or relapse of acute psychiatric symptoms during her postpartum period. -Will start Haldol 5 mg p.o. twice daily/IM.  We will also order Ativan 2 mg p.o./IM po.  Patient was previously stabilized on Abilify 5 mg oral and Haldol 1 mg p.o. twice daily, olanzapine Zydis and IM every 4 hours as needed on her previous inpatient psychiatric discharge while in [place]. -Will assess patient once awake, will place standing orders at this time. -Will notify nursing staff, to watch out for courier services and delivery of hand pump, to prevent mastitis in a patient who is solely lactating. -Patient will require inpatient psychiatric admission, once assess.  There has been communication with [facility] perinatal facility, however there are no appropriate beds available through the holiday weekend.
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
## 2021-01-29 NOTE — BH Assessment (Addendum)
@  1007, attempted to complete patient's TTS. She was observantly lethargic. Clinician called her name multiple times and she did not respond. Patient asked to provide her name and DOB and no response, patient sleeping. Clinician notified patient's nurse and charge nurse. Requested that TTS is contacted when patient is more alert and able to participate in the assessment process.

## 2021-01-29 NOTE — BH Assessment (Addendum)
TTS ordered for patient. @0914 , Clinician requested patient's nurse (Abby, RN) and/or charge nurse , RN) to set up the TTS machine for patient to be seen.

## 2021-01-29 NOTE — BH Assessment (Addendum)
Clinician made contact with pt's team at 2041 in regards to completing pt's MH Assessment. Pt's nurse, Shanda Bumps RN, stated at 2043 that pt continues to sleep and agreed to make contact with clinician when pt is awake/coherent.

## 2021-01-29 NOTE — BH Assessment (Signed)
[facility] Assessment Progress Note   At 14:42 pt's husband, Trinna Post [phone]), calls to ask for hospital address, and to ask about bringing food for this pt.  I informed him that outside food is not permitted for pt, and that [facility] will take care of all of her dietary needs while she is in our care.  Doylene Canning, [place] [facility] Coordinator [phone]
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
## 2021-01-30 NOTE — ED Notes (Addendum)
Transportation will not be available until tomorrow. MD and [facility] made aware.

## 2021-01-30 NOTE — ED Notes (Signed)
Multiple attempts have been made to arrange transportation though the [facility] department. RN will continue to try to reach someone.

## 2021-01-30 NOTE — ED Notes (Signed)
Pt ambulated to restroom, changed into new clean gown, linens changed.

## 2021-01-30 NOTE — ED Notes (Signed)
Pt's gown and sheets were changed, cardiac leads reconnected.

## 2021-01-30 NOTE — BH Assessment (Signed)
[facility] Assessment Progress Note   Per Caryn Bee, NP , this pt requires psychiatric hospitalization at this time.  Pt presents under IVC initiated by EDP Gloris Manchester, MD.  Debbe Bales RN, [facility] at [facility] reports that they currently have inadequate staffing to accept patients at this time.  At the direction of Nelly Rout, MD this writer has sought placement for this patient at facilities outside of the [facility] system.  The following facilities have been contacted to seek placement for this pt, with results as noted:  Beds available, information sent, decision pending: [facility]  At capacity: [facility]   Doylene Canning, [place] Behavioral Health Coordinator [phone]

## 2021-01-30 NOTE — ED Notes (Addendum)
Patient was given her lunch. She was also given a bucket of ice to store her breast milk.

## 2021-01-30 NOTE — ED Notes (Signed)
Pt asleep.

## 2021-01-30 NOTE — BH Assessment (Signed)
[facility] Assessment Progress Note   Per Caryn Bee, NP , this pt requires psychiatric hospitalization at this time.  Pt presents under IVC initiated by EDP Gloris Manchester, MD.  At 13:04 Ron calls from [facility] to report that pt has been accepted to their main campus by Dr Estill Cotta.  Fredna Dow concurs with this decision.  Dr Durwin Nora and pt's nurse, Lamar Laundry, have been notified, and Lamar Laundry agrees to call report to [phone].  Pt is to be transported via [facility].  Doylene Canning Behavioral Health Coordinator [phone]

## 2021-01-30 NOTE — BH Assessment (Signed)
Comprehensive Clinical Assessment (CCA) Note  01/30/2021 Holly Craig [id number]  Disposition: TTS completed. Per Priscille Loveless, DNP, patient will require inpatient psychiatric admission, once assess.  There has been communication with [facility] perinatal facility, however there are no appropriate beds available through the holiday weekend.  [place] ED from 01/29/2021 in [facility] DEPT Admission (Discharged) from 12/25/2020 in [facility] 5S Mother Baby Unit Admission (Discharged) from 10/27/2020 in [facility] 1S Maternity Assessment Unit  C-SSRS RISK CATEGORY No Risk No Risk No Risk      The patient demonstrates the following risk factors for suicide: Chronic risk factors for suicide include: psychiatric disorder of Acute Psychosis, Anxiety Disorder, and Depressive Disorder, Mild . Acute risk factors for suicide include:  acute psychosis  . Protective factors for this patient include: life satisfaction. Considering these factors, the overall suicide risk at this point appears to be "No Risk". Patient is not appropriate for outpatient follow up until psych cleared.    Chief Complaint:  Chief Complaint  Patient presents with   Altered Mental Status   Psychiatric Evaluation   Visit Diagnosis: Acute Psychosis, Anxiety Disorder, and Depressive Disorder, Mild  Upon chart review the [facility] Provider Sheran Fava, DNP) notes, "This nurse practitioner has attempted to reassess patient x2 since admission to the emergency room.  Patient remains asleep at this time.  Considering patient has not slept for 2 days, currently experiencing psychosis, paranoid ideations, and hallucinations will recommend allowing patient to sleep. In the interim I have spoken with her husband [name], and obtain collateral.  As per husband he states his wife has been psychiatrically stable until last night.  He reports 3 previous hospitalizations in [place] in which patient required prolonged  length of stay due to psychosis, mania, and noncompliance with medication.  He said she was subsequently trialed on Haldol oral initially, prior to converting to Haldol injection in which she was stabilized and successful on.  He states she took the Haldol from January 2022 until June 2022, in which they left [place] and moved to [place].  He states since moving to [place] they have had a hard time reestablishing of services and she has not taken any medications since then.  She is able to provide some events leading up to this hospitalization which include "she did not sleep well on Monday night, did not eat anything yesterday.  And then last night and noticed she was not normal or not acting like herself.  She was very calm and did not want any sleep. Needed to get her some help which is when I called for EMS.  "  This nurse practitioner provided psychoeducation, risks versus benefits information to the husband as it pertains to ongoing psychosis, lactation, and treatment for such.  Her husband appears to verbalize understanding, and states he would like to proceed with Haldol, and continue breast-feeding.  He also understands it is necessary for patient to be admitted to inpatient psychiatric behavioral health facility for crisis stabilization, medication management.  He appears to have some insight regarding his wife's mental illness.  He reports they are currently using condoms as a method of contraception.  He also states the children are home with him, and are safe and appear to be doing well at this time.  He further reports that he has to go to work from 3-12 30 this evening, however he expects to receive a break at 5:45 PM and 8:45 PM.  He is working to phone  call and updates during those times.  He is unable to locate the proper best pump supplies, and states mother has been solely nursing since delivery of the baby and will need assistance in getting no supplies for her.  This nurse  practitioner did contact [place] [facility] [place] [facility], in order to coordinate supplies and delivery from the hospital.  This writer spoke with [place], [facility] who states she will send all supplies over as well as storage bottles via in the courier service".     Chart review from [place] [facility] on her last discharge in February 2022 : "PT was sent directly to [facility] from outpatient clinic ([facility], Dr. 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 Patient was discharged from inpatient psychiatry unit in November of 2021, and while she was compliant with follow up appointments she has not been adherent to medication regimen (Abilify, lithium, and olanzapine) since shortly after the time of discharge.She was started on haloperidol 03/11/2019. It is unclear if pt has been taking this, although unlikely given her statement on admission that she does not take the psychiatric medication because it "makes (her) sick". She was placed on suicide precautions with in-person sitter. She refused aripiprazole and never required olanzapine prn. She thinks she can cope with the voices on her own and does not need medications."  TTS assessment 01/30/2021: Holly Craig is a 30 y/o female. Clinician observed patient sitting upright in the hospital bed. She was alert and participated in the TTS assessment with no complications.   Clinician asked patient what brought her to the Emergency Department and she responded, "I was screaming". When asked the reason for screaming she says, "I didn't really have any reason, I was just screaming". Denies that she has displayed similar behaviors in the past.  Denies that she has a mental health history and/or diagnosis. Denies that she is taking mental health medications, has a therapist, and/or psychiatrist.   Denies suicidal ideations. Denies hx of suicide attempts and/or gestures. Denies stressors.  She does acknowledge on-going issues with depression and anxiety. Current depressive symptoms include: angry/irritable and fatigue. She denies sleep disturbances and sleeps 8 hrs per night. No changes with her appetite. Denies hx of abuse and/or trauma. Patient considers her spouse to be her support system. She lives with her spouse and has 5 children.   Denies homicidal ideations. Denies hx of aggressive and/or assaultive behaviors. Denies AVH's. Denies hx of alcohol and/or drug use. Denies that she has ever been admitted to a hospital for inpatient psychiatric treatment. Denies that she has an outpatient therapist.   CCA Screening, Triage and Referral (STR)  Patient Reported Information How did you hear about [place]? No data recorded What Is the Reason for Your Visit/Call Today? Per chart review from the [facility] provider: This nurse practitioner has attempted to reassess patient x2 since admission to the emergency room.  Patient remains asleep at this time.  Considering patient has not slept for 2 days, currently experiencing psychosis, paranoid ideations, and hallucinations will recommend allowing patient to sleep.     In the interim I have spoken with her husband [name], and obtain collateral.  As per husband he states his wife has been psychiatrically stable until last night.  He reports 3 previous hospitalizations in [place] in which patient required prolonged length of stay due to psychosis, mania, and noncompliance with medication.  He said she was subsequently trialed on Haldol oral initially,  prior to converting to Haldol injection in which she was stabilized and successful on.  He states she took the Haldol from January 2022 until June 2022, in which they left [place] and moved to [place].  He states since moving to [place] they have had a hard time reestablishing of services and she has not taken any medications since then.  She is able to provide some events leading up to this  hospitalization which include "she did not sleep well on Monday night, did not eat anything yesterday.  And then last night and noticed she was not normal or not acting like herself.  She was very calm and did not want any sleep. Needed to get her some help which is when I called for EMS.  "  This nurse practitioner provided psychoeducation, risks versus benefits information to the husband as it pertains to ongoing psychosis, lactation, and treatment for such.  Her husband appears to verbalize understanding, and states he would like to proceed with Haldol, and continue breast-feeding.  He also understands it is necessary for patient to be admitted to inpatient psychiatric behavioral health facility for crisis stabilization, medication management.  He appears to have some insight regarding his wife's mental illness.  He reports they are currently using condoms as a method of contraception.  He also states the children are home with him, and are safe and appear to be doing well at this time.  He further reports that he has to go to work from 3-12 30 this evening, however he expects to receive a break at 5:45 PM and 8:45 PM.  How Long Has This Been Causing You Problems? 1-6 months  What Do You Feel Would Help You the Most Today? Treatment for Depression or other mood problem; Stress Management; Medication(s)   Have You Recently Had Any Thoughts About Hurting Yourself? No  Are You Planning to Commit Suicide/Harm Yourself At This time? No   Have you Recently Had Thoughts About [place]? No  Are You Planning to Harm Someone at This Time? No  Explanation: No data recorded  Have You Used Any Alcohol or Drugs in the Past 24 Hours? No  How Long Ago Did You Use Drugs or Alcohol? No data recorded What Did You Use and How Much? No data recorded  Do You Currently Have a Therapist/Psychiatrist? No  Name of Therapist/Psychiatrist: No data recorded  Have You Been Recently Discharged From Any  Office Practice or Programs? No  Explanation of Discharge From Practice/Program: No data recorded    CCA Screening Triage Referral Assessment Type of Contact: Tele-Assessment  Telemedicine Service Delivery: Telemedicine service delivery: This service was provided via telemedicine using a 2-way, interactive audio and video technology  Is this Initial or Reassessment? Initial Assessment  Date Telepsych consult ordered in [facility]:  01/30/21  Time Telepsych consult ordered in [facility]:  No data recorded Location of Assessment: [facility]  Provider Location: Other (comment) ([facility])   Collateral Involvement: No data recorded  Does Patient Have a [place]? No data recorded Name and Contact of Legal Guardian: No data recorded If Minor and Not Living with Parent(s), Who has Custody? No data recorded Is CPS involved or ever been involved? Never  Is APS involved or ever been involved? Never   Patient Determined To Be At Risk for Harm To Self or Others Based on Review of Patient Reported Information or Presenting Complaint? No  Method: No data recorded Availability of Means: No  data recorded Intent: No data recorded Notification Required: No data recorded Additional Information for Danger to Others Potential: No data recorded Additional Comments for Danger to Others Potential: No data recorded Are There Guns or Other Weapons in Your Home? No data recorded Types of Guns/Weapons: No data recorded Are These Weapons Safely Secured?                            No data recorded Who Could Verify You Are Able To Have These Secured: No data recorded Do You Have any Outstanding Charges, Pending Court Dates, Parole/Probation? No data recorded Contacted To Inform of Risk of Harm To Self or Others: No data recorded   Does Patient Present under Involuntary Commitment? No  IVC Papers Initial File Date: No data recorded  [place] of Residence: [place]   Patient Currently Receiving the  Following Services: -- (Patient denies)   Determination of Need: Emergent (2 hours)   Options For Referral: Inpatient Hospitalization; Medication Management     CCA Biopsychosocial Patient Reported Schizophrenia/Schizoaffective Diagnosis in Past: No   Strengths: answered questions asked   Mental Health Symptoms Depression:  No data recorded  Duration of Depressive symptoms:    Mania:  No data recorded  Anxiety:   No data recorded  Psychosis:  No data recorded  Duration of Psychotic symptoms:    Trauma:  No data recorded  Obsessions:  No data recorded  Compulsions:  No data recorded  Inattention:  No data recorded  Hyperactivity/Impulsivity:  No data recorded  Oppositional/Defiant Behaviors:  No data recorded  Emotional Irregularity:  No data recorded  Other Mood/Personality Symptoms:  No data recorded   Mental Status Exam Appearance and self-care  Stature:   Average   Weight:   Average weight   Clothing:   Neat/clean   Grooming:   Normal   Cosmetic use:   Age appropriate   Posture/gait:   Normal   Motor activity:   Not Remarkable   Sensorium  Attention:   Normal   Concentration:   Normal   Orientation:   Time; Situation; Place; Person; Object   Recall/memory:   Normal   Affect and Mood  Affect:   Appropriate   Mood:   Depressed   Relating  Eye contact:   Normal   Facial expression:   Depressed   Attitude toward examiner:   Cooperative   Thought and Language  Speech flow:  Clear and Coherent   Thought content:  No data recorded  Preoccupation:   None   Hallucinations:   None   Organization:  No data recorded  [employer] of Knowledge:   Fair   Intelligence:   Average   Abstraction:   Normal   Judgement:   Normal   Reality Testing:   Adequate   Insight:  No data recorded  Decision Making:   Normal   Social Functioning  Social Maturity:   Self-centered   Social Judgement:   Normal    Stress  Stressors:   Other (Comment) (Patient denies)   Coping Ability:   Overwhelmed; Exhausted   Skill Deficits:   Self-control   Supports:  No data recorded    Religion: Religion/Spirituality Are You A Religious Person?: Yes What is Your Religious Affiliation?:  Darrick Meigs) How Might This Affect Treatment?: unknown  Leisure/Recreation: Leisure / Recreation Do You Have Hobbies?: No  Exercise/Diet: Exercise/Diet Do You Exercise?: Yes What Type of Exercise Do You Do?: Run/Walk  How Many Times a Week Do You Exercise?: 1-3 times a week Have You Gained or Lost A Significant Amount of Weight in the Past Six Months?: No Do You Follow a Special Diet?: No Do You Have Any Trouble Sleeping?: No (8hr per night)   CCA Employment/Education Employment/Work Situation: Employment / Work Situation Employment Situation: Unemployed Patient's Job has Been Impacted by Current Illness: No Has Patient ever Been in Equities trader?: No  Education: Education Is Patient Currently Attending School?: No 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) Did Theme park manager?: No Did You Have An Individualized Education Program (IIEP): No Did You Have Any Difficulty At School?: No Patient's Education Has Been Impacted by Current Illness: No   CCA Family/Childhood History Family and Relationship History: Family history Marital status: Married Number of Years Married:  (11 years) What types of issues is patient dealing with in the relationship?: none reproted Additional relationship information: none reported Does patient have children?: Yes How many children?:  (5 children) How is patient's relationship with their children?: States that she has a great relationship with her children  Childhood History:  Childhood History By whom was/is the patient raised?: Other (Comment) ("I was an orphan") Did patient suffer any verbal/emotional/physical/sexual abuse as a child?: No Did patient suffer  from severe childhood neglect?: No Has patient ever been sexually abused/assaulted/raped as an adolescent or adult?: No Was the patient ever a victim of a crime or a disaster?: No Witnessed domestic violence?: No Has patient been affected by domestic violence as an adult?: No  Child/Adolescent Assessment:     CCA Substance Use Alcohol/Drug Use: Alcohol / Drug Use Pain Medications: SEE MAR Prescriptions: SEE MAR Over the Counter: SEE MAR History of alcohol / drug use?: No history of alcohol / drug abuse                         ASAM's:  Six Dimensions of Multidimensional Assessment  Dimension 1:  Acute Intoxication and/or Withdrawal Potential:      Dimension 2:  Biomedical Conditions and Complications:      Dimension 3:  Emotional, Behavioral, or Cognitive Conditions and Complications:     Dimension 4:  Readiness to Change:     Dimension 5:  Relapse, Continued use, or Continued Problem Potential:     Dimension 6:  Recovery/Living Environment:     ASAM Severity Score:    ASAM Recommended Level of Treatment:     Substance use Disorder (SUD)    Recommendations for Services/Supports/Treatments: Recommendations for Services/Supports/Treatments Recommendations For Services/Supports/Treatments: Medication Management, Inpatient Hospitalization  Discharge Disposition:    DSM5 Diagnoses: Patient Active Problem List   Diagnosis Date Noted   Preeclampsia 12/27/2020   Vaginal birth after cesarean delivery 12/27/2020   UTI (urinary tract infection) in pregnancy, antepartum, third trimester 11/22/2020   Benign gestational thrombocytopenia in third trimester (HCC) 11/18/2020   History of nephrectomy, 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   Obesity in pregnancy 11/18/2020   BMI 40.0-44.9, adult (HCC) 11/18/2020   Late prenatal care 11/15/2020   Supervision of high risk pregnancy in third trimester 11/15/2020   History of severe pre-eclampsia 10/27/2020   Schizoaffective disorder (HCC)  10/27/2020   Bipolar 1 disorder (HCC) 10/27/2020   History of cesarean delivery 10/27/2020     Referrals to Alternative Service(s): Referred to Alternative Service(s):   Place:   Date:   Time:    Referred to Alternative Service(s):   Place:   Date:   Time:  Referred to Alternative Service(s):   Place:   Date:   Time:    Referred to Alternative Service(s):   Place:   Date:   Time:     Waldon Merl, Counselor

## 2021-01-30 NOTE — BH Assessment (Signed)
@  1030, requested Issac, RN to place the TTS machine in patient's room for her initial TTS assessment to be completed.

## 2021-01-30 NOTE — ED Notes (Signed)
Patient is alert, calm and cooperative. She has agreed to try to use the hand pump.

## 2021-01-30 NOTE — ED Notes (Signed)
Pt agitated and refusing to get in bed. Pt screaming at staff in bursts, med order placed by PA.

## 2021-01-30 NOTE — ED Provider Notes (Signed)
Emergency Medicine Observation Re-evaluation Note  Holly Craig is a 30 y.o. female, seen on rounds today.  Pt initially presented to the ED for complaints of Altered Mental Status Currently, the patient is sleeping.  Physical Exam  BP 112/83   Pulse 85   Temp 98.9 F (37.2 C) (Rectal)   Resp 14   Ht 5\' 1"  (1.549 m)   Wt 90.7 kg   SpO2 98%   BMI 37.79 kg/m  Physical Exam General: Sleeping, nondistressed Cardiac: Regular rate and rhythm Lungs: Breathing is even and unlabored Psych: Deferred  ED Course / MDM  EKG:EKG Interpretation  Date/Time:  Wednesday January 29 2021 11:00:38 EST Ventricular Rate:  79 PR Interval:  158 QRS Duration: 92 QT Interval:  371 QTC Calculation: 426 R Axis:   48 Text Interpretation: Sinus rhythm Normal ECG When compared with ECG of EARLIER SAME DATE HEART RATE has decreased Confirmed by [id number] (Dione Booze) on 01/30/2021 6:22:51 AM  I have reviewed the labs performed to date as well as medications administered while in observation.  Recent changes in the last 24 hours include patient presented yesterday with concerns of mania/psychosis.  Plan  Current plan is for inpatient psychiatric care.  Holly Craig is under involuntary commitment.     02/01/2021, MD 01/30/21 [phone]

## 2021-01-30 NOTE — Consult Note (Signed)
  Patient is seen and reassessed by this nurse practitioner. She is observed to be sleeping, however is easily awoken and able to communicate. Patient initially states " I am here because I was screaming. " She is very vague and does not appear to be forth coming with much information and or details about her screaming. She will not answer what she was screaming at. Writer made multiple attempts to get details about her psychiatric symptoms prior to this ER visits, in which she denies. Patient later states " I am here because I have an infection. I am not here because I am sick. I want to go, Im not sick. " Patient denies her screaming at the walls, and swinging the floor lamp in the air. She further denies any difficulty sleeping or insomnia. She denies any acute psychiatric symptoms such as paranoia, delusions, hallucinations, mania, depression, and or anxiety. She is unable to recall being violent and or physically assaulting staff at this time.  Unfortunately she lacks much insight and judgement at this time chart reviewed surprisingly is suggestive symptoms of memory loss, emotional detachment, after each pregnancy, since the age of 67 (please note no further medical records are available for November 08, 2015 ).    She makes no eye contact with this provider despite multiple attempts to move around the room in an attempt to increase engagement and participation. She is observed with a blank stare, and appears to have some disorganized thinking. When asking about her breast feeding success she states " there is not enough milk". She is observed making gestures to squeeze her breasts as if to check for engorgement or fullness, and then she immediately returns to her blank stare. She appears to be withdrawn and not engaged with this Clinical research associate. She also has limited communication, and is unable to hold linear conversation with the Clinical research associate. She presents with blunted affect and alogia, and minimal expressions at this  time. Patient current symptoms appear to be consistent with decompensation of her schizophrenia, and is displaying negative symptoms at this time. When inquiring about resumption of Haldol pill and or Haldol decanoate, she denies stating " I am not sick. I dont need it. " Unfortunately she lacks much insight and judgement at this time.  -Will require inpatient psychiatric admission at this time. If there are no appropriate beds available at [facility], patient will need to be referred out of system at this time. Patient will require inpatient psychiatric admission for crisis stabilization, medication management, and coping skills. She does meet criteria for involuntary commitment criteria, if she refuse at that time. Patient is a danger to herself and others, she has (5) young children at home (ages 21-1 month).  -Continue Haldol 5 mg p.o. twice daily/IM -Continue as needed agitation protocol.  Although it does not appear the patient has exhibited any disruptive behaviors, aggression, agitation, or displayed any psychosis or delusional thought disorder.  She has not had to receive any as needed medications> 24 hours. -She is being treated for asymptomatic bacteriuria with cephalexin 250 mg p.o. every 6 hours x5 days.

## 2021-01-31 NOTE — ED Notes (Signed)
Called [place] at [facility] to give report.

## 2021-02-07 ENCOUNTER — Ambulatory Visit: Payer: Medicaid Other | Admitting: Family Medicine

## 2021-02-24 ENCOUNTER — Other Ambulatory Visit: Payer: Self-pay

## 2021-02-24 ENCOUNTER — Encounter (HOSPITAL_BASED_OUTPATIENT_CLINIC_OR_DEPARTMENT_OTHER): Payer: Self-pay

## 2021-02-24 ENCOUNTER — Emergency Department (HOSPITAL_BASED_OUTPATIENT_CLINIC_OR_DEPARTMENT_OTHER)
Admission: EM | Admit: 2021-02-24 | Discharge: 2021-02-24 | Disposition: A | Discharge status: Home / Self Care | Payer: Medicaid Other | Attending: Emergency Medicine | Admitting: Emergency Medicine

## 2021-02-24 DIAGNOSIS — N751 Abscess of Bartholin's gland: Secondary | ICD-10-CM

## 2021-02-24 DIAGNOSIS — R300 Dysuria: Secondary | ICD-10-CM | POA: Diagnosis present

## 2021-02-24 LAB — URINALYSIS, ROUTINE W REFLEX MICROSCOPIC
Bilirubin Urine: NEGATIVE
Glucose, UA: NEGATIVE mg/dL
Ketones, ur: NEGATIVE mg/dL
Leukocytes,Ua: NEGATIVE
Nitrite: NEGATIVE
Protein, ur: 100 mg/dL — AB
Specific Gravity, Urine: 1.01 (ref 1.005–1.030)
pH: 6 (ref 5.0–8.0)

## 2021-02-24 LAB — PREGNANCY, URINE: Preg Test, Ur: NEGATIVE

## 2021-02-24 LAB — URINALYSIS, MICROSCOPIC (REFLEX)

## 2021-02-24 MED ORDER — LIDOCAINE HCL 2 % IJ SOLN
20.0000 mL | Freq: Once | INTRAMUSCULAR | Status: AC
  Administered 2021-02-24: 18:00:00 400 mg
  Filled 2021-02-24: qty 20

## 2021-02-24 MED ORDER — HYDROCODONE-ACETAMINOPHEN 5-325 MG PO TABS: 1.0000 | ORAL_TABLET | Freq: Four times a day (QID) | ORAL | 0 refills | Status: DC | PRN

## 2021-02-24 MED ORDER — DOXYCYCLINE HYCLATE 100 MG PO CAPS: 100.0000 mg | ORAL_CAPSULE | Freq: Two times a day (BID) | ORAL | 0 refills | Status: AC

## 2021-02-24 MED ORDER — DOXYCYCLINE HYCLATE 100 MG PO CAPS: 100.0000 mg | ORAL_CAPSULE | Freq: Two times a day (BID) | ORAL | 0 refills | Status: DC

## 2021-02-24 MED ORDER — DOXYCYCLINE HYCLATE 100 MG PO TABS
100.0000 mg | ORAL_TABLET | Freq: Once | ORAL | Status: AC
  Administered 2021-02-24: 19:00:00 100 mg via ORAL
  Filled 2021-02-24: qty 1

## 2021-02-24 MED ORDER — HYDROCODONE-ACETAMINOPHEN 5-325 MG PO TABS: 1.0000 | ORAL_TABLET | Freq: Four times a day (QID) | ORAL | 0 refills | Status: AC | PRN

## 2021-02-24 NOTE — ED Notes (Signed)
ED Provider at bedside. 

## 2021-02-24 NOTE — ED Triage Notes (Signed)
Pt c/o dysuria x 2 weeks-left side abd pain x 1 month-"bump on my vagina" x 4 days-NAD-steady gait

## 2021-02-24 NOTE — ED Provider Notes (Signed)
[facility] EMERGENCY DEPARTMENT Provider Note   CSN: [id number] Arrival date & time: 02/24/21  1508     History Chief Complaint  Patient presents with   Dysuria    Holly Craig is a 30 y.o. female history of bipolar here presenting with swelling in the left vaginal area.  Patient states that she has some dysuria for the last month or so.  She also noticed some swelling in the left side of her vagina about 4 to 5 days.  She states that she is sexually active with 1 female partner.  Denies any vaginal discharge.  The history is provided by the patient.      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     OB History     Gravida  10   Para  5   Term  5   Preterm      AB  5   Living  5      SAB  5   IAB      Ectopic      Multiple  0   Live Births  5           Family History  Problem Relation Age of Onset   Cancer Neg Hx    Diabetes Neg  Hx    Hypertension Neg Hx     Social History   Tobacco Use   Smoking status: Never   Smokeless tobacco: Never  Vaping Use   Vaping Use: Never used  Substance Use Topics   Alcohol use: Not Currently   Drug use: Never    Home Medications Prior to Admission medications   Medication Sig Start Date End Date Taking? Authorizing Provider  acetaminophen (TYLENOL) 325 MG tablet Take 2 tablets (650 mg total) by mouth every 4 (four) hours as needed (for pain scale < 4). Maximum dose of acetaminophen in 4000 mg from all sources in 24 hours. Patient not taking: Reported on 01/29/2021 12/27/20   Shelby Mattocks, DO    Allergies    Ibuprofen  Review of Systems   Review of Systems  Genitourinary:  Positive for dysuria and pelvic pain.  All other systems reviewed and are negative.  Physical Exam Updated Vital Signs BP (!) 147/89 (BP Location: Left Arm)    Pulse 97    Temp 99.2 F (37.3 C) (Oral)    Resp 18    Ht 5\' 1"  (1.549 m)  Wt 100.7 kg    LMP 02/06/2021    SpO2 100%    BMI 41.95 kg/m   Physical Exam Vitals and nursing note reviewed.  Constitutional:      Appearance: Normal appearance.  HENT:     Head: Normocephalic.     Nose: Nose normal.     Mouth/Throat:     Mouth: Mucous membranes are moist.  Eyes:     Extraocular Movements: Extraocular movements intact.     Pupils: Pupils are equal, round, and reactive to light.  Cardiovascular:     Rate and Rhythm: Normal rate and regular rhythm.     Pulses: Normal pulses.  Pulmonary:     Effort: Pulmonary effort is normal.  Abdominal:     General: Abdomen is flat.  Genitourinary:    Comments: Patient has obvious swelling in the left Bartholin's area.  No purulent discharge.  There is obvious fluctuance as well Musculoskeletal:     Cervical back: Normal range of motion.  Skin:    Capillary Refill: Capillary refill takes less than 2 seconds.  Neurological:     General: No focal deficit present.     Mental Status: She is alert  and oriented to person, place, and time.  Psychiatric:        Mood and Affect: Mood normal.        Behavior: Behavior normal.        Thought Content: Thought content normal.    ED Results / Procedures / Treatments   Labs (all labs ordered are listed, but only abnormal results are displayed) Labs Reviewed  URINALYSIS, ROUTINE W REFLEX MICROSCOPIC - Abnormal; Notable for the following components:      Result Value   Hgb urine dipstick TRACE (*)    Protein, ur 100 (*)    All other components within normal limits  URINALYSIS, MICROSCOPIC (REFLEX) - Abnormal; Notable for the following components:   Bacteria, UA MANY (*)    All other components within normal limits  PREGNANCY, URINE    EKG None  Radiology No results found.  Procedures Procedures   INCISION AND DRAINAGE Performed by: Richardean Canal Consent: Verbal consent obtained. Risks and benefits: risks, benefits and alternatives were discussed Type: abscess  Body area:L bartholin's abscess  Anesthesia: local infiltration  Incision was made with a scalpel.  Local anesthetic: lidocaine 2 % no epinephrine  Anesthetic total: 5 ml  Complexity: complex Blunt dissection to break up loculations  Drainage: purulent  Drainage amount: copious   Packing material: Word catheter  Patient tolerance: Patient tolerated the procedure well with no immediate complications.    Medications Ordered in ED Medications  lidocaine (XYLOCAINE) 2 % (with pres) injection 400 mg (400 mg Infiltration Given by Other 02/24/21 1815)  doxycycline (VIBRA-TABS) tablet 100 mg (100 mg Oral Given 02/24/21 1839)    ED Course  I have reviewed the triage vital signs and the nursing notes.  Pertinent labs & imaging results that were available during my care of the patient were reviewed by me and considered in my medical decision making (see chart for details).    MDM Rules/Calculators/A&P                         Holly Craig is a 30 y.o.  female here presenting with Bartholin's abscess.  Patient has obvious abscess on exam.  I performed I&D and there was copious drainage.  I placed a Word catheter and put patient on doxycycline.  We will bring patient back in 3 days for wound check.  The catheter may be removed at that time or deflated depending on the amount of drainage     Final Clinical Impression(s) / ED Diagnoses Final diagnoses:  None    Rx / DC Orders ED Discharge Orders     None        Charlynne Pander, MD 02/24/21 [place]

## 2021-02-24 NOTE — Discharge Instructions (Signed)
You have a Bartholin's abscess.  I placed a catheter into the abscess and you are expected to have some drainage.  You can try sitz bath to help with discomfort  Please take doxycycline twice daily for a week  You also can take Tylenol or Motrin for pain.  Take hydrocodone for severe pain.  You need to come back to the ED in 3 days for reassessment.  We will see if the catheter is ready to come out at that time.  Return to ER if you have worse vaginal swelling, severe pain, fever.

## 2021-02-28 ENCOUNTER — Other Ambulatory Visit: Payer: Self-pay

## 2021-02-28 ENCOUNTER — Emergency Department (HOSPITAL_BASED_OUTPATIENT_CLINIC_OR_DEPARTMENT_OTHER)
Admission: EM | Admit: 2021-02-28 | Discharge: 2021-02-28 | Disposition: A | Discharge status: Home / Self Care | Payer: Medicaid Other | Attending: Emergency Medicine | Admitting: Emergency Medicine

## 2021-02-28 ENCOUNTER — Encounter (HOSPITAL_BASED_OUTPATIENT_CLINIC_OR_DEPARTMENT_OTHER): Payer: Self-pay | Admitting: *Deleted

## 2021-02-28 DIAGNOSIS — Z4800 Encounter for change or removal of nonsurgical wound dressing: Secondary | ICD-10-CM | POA: Insufficient documentation

## 2021-02-28 DIAGNOSIS — N751 Abscess of Bartholin's gland: Secondary | ICD-10-CM | POA: Diagnosis not present

## 2021-02-28 DIAGNOSIS — Z09 Encounter for follow-up examination after completed treatment for conditions other than malignant neoplasm: Secondary | ICD-10-CM

## 2021-02-28 NOTE — ED Triage Notes (Signed)
States she is here for a recheck of an I&D of her labia. Here to have packing removed.

## 2021-02-28 NOTE — ED Provider Notes (Signed)
[facility] EMERGENCY DEPARTMENT Provider Note   CSN: [id number] Arrival date & time: 02/28/21  1343     History Chief Complaint  Patient presents with   Wound Check    Holly Craig is a 30 y.o. female had a Bartholin gland cyst drained on Monday and is here for wound check.  She reports that the catheter fell out yesterday.  Denies any fevers or chills, discharge or bleeding from the area.    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     OB History     Gravida  10   Para  5   Term  5   Preterm      AB  5   Living  5      SAB  5   IAB      Ectopic      Multiple  0   Live Births  5           Family History  Problem Relation Age of Onset   Cancer Neg Hx    Diabetes Neg Hx    Hypertension Neg Hx     Social History   Tobacco Use   Smoking status: Never   Smokeless tobacco: Never  Vaping Use   Vaping Use: Never used   Substance Use Topics   Alcohol use: Not Currently   Drug use: Never    Home Medications Prior to Admission medications   Medication Sig Start Date End Date Taking? Authorizing Provider  doxycycline (VIBRAMYCIN) 100 MG capsule Take 1 capsule (100 mg total) by mouth 2 (two) times daily. One po bid x 7 days 02/24/21  Yes Charlynne Pander, MD  acetaminophen (TYLENOL) 325 MG tablet Take 2 tablets (650 mg total) by mouth every 4 (four) hours as needed (for pain scale < 4). Maximum dose of acetaminophen in 4000 mg from all sources in 24 hours. Patient not taking: Reported on 01/29/2021 12/27/20   Shelby Mattocks, DO  HYDROcodone-acetaminophen (NORCO/VICODIN) 5-325 MG tablet Take 1 tablet by mouth every 6 (six) hours as needed. 02/24/21   Charlynne Pander, MD    Allergies    Ibuprofen  Review of Systems   Review of Systems  Constitutional:  Negative for chills and fever.  Genitourinary:  Negative for vaginal discharge and vaginal pain.   Physical Exam Updated Vital Signs BP 113/80 (BP Location: Right Arm)    Pulse 63  Temp 97.8 F (36.6 C) (Oral)    Resp 14    Ht 5\' 1"  (1.549 m)    Wt 100.7 kg    LMP 02/06/2021    SpO2 98%    BMI 41.95 kg/m   Physical Exam Vitals and nursing note reviewed.  Constitutional:      Appearance: Normal appearance.  HENT:     Head: Normocephalic and atraumatic.  Eyes:     General: No scleral icterus.    Conjunctiva/sclera: Conjunctivae normal.  Pulmonary:     Effort: Pulmonary effort is normal. No respiratory distress.  Genitourinary:    General: Normal vulva.     Vagina: No vaginal discharge.     Comments: No signs of infection, very small healed lesion to the left lower introitus Skin:    Findings: No rash.  Neurological:     Mental Status: She is alert.  Psychiatric:        Mood and Affect: Mood normal.    ED Results / Procedures / Treatments   Labs (all labs ordered are listed, but only abnormal results are displayed) Labs Reviewed -  No data to display  EKG None  Radiology No results found.  Procedures Procedures   Medications Ordered in ED Medications - No data to display  ED Course  I have reviewed the triage vital signs and the nursing notes.  Pertinent labs & imaging results that were available during my care of the patient were reviewed by me and considered in my medical decision making (see chart for details).    MDM Rules/Calculators/A&P 30 year old presenting for wound check after having a Bartholin cyst drained on Monday.  The area has healed well without signs of infection.  She is not had systemic signs either.  Physical exam benign, safe for discharge. Final Clinical Impression(s) / ED Diagnoses Final diagnoses:  Encounter for recheck of abscess following incision and drainage    Rx / DC Orders Results and diagnoses were explained to the patient. Return precautions discussed in full. Patient had no additional questions and expressed complete understanding.     Wednesday 02/28/21 1447    03/02/21, MD 03/04/21 [phone]

## 2021-02-28 NOTE — Discharge Instructions (Addendum)
There are no signs of infection in the area today.  Return if your symptoms worsen or the area reforms.

## 2022-11-21 IMAGING — CT CT HEAD W/O CM
3 series · 16 of 47 positions shown, 19 images · non-contrast
Comparison: None.

CLINICAL DATA: Mental status change with unknown cause.

EXAM:
CT HEAD WITHOUT CONTRAST
TECHNIQUE: Contiguous axial images were obtained from the base of the skull
through the vertex without intravenous contrast.

[Series 2: head wo · axial · 0.47mm/px · z∈[-128,-3]mm · 10 of 30 slices shown, 13 images]
[im 3/30  brain]
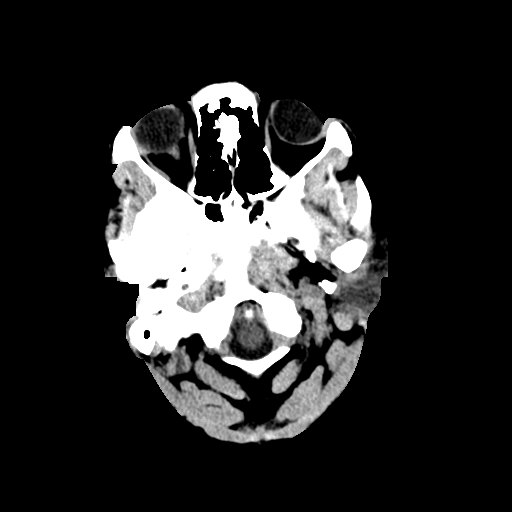
[im 3/30  bone]
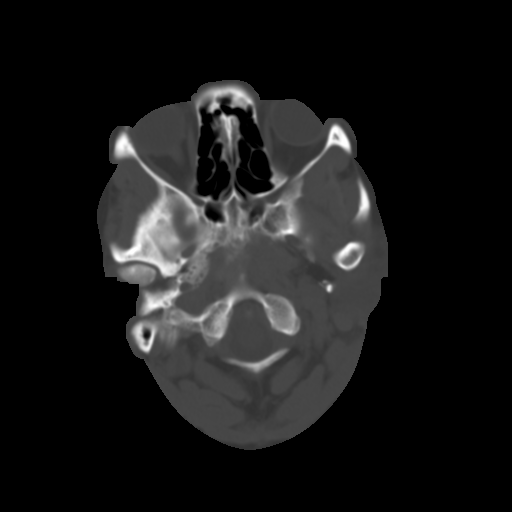
[im 6/30  brain]
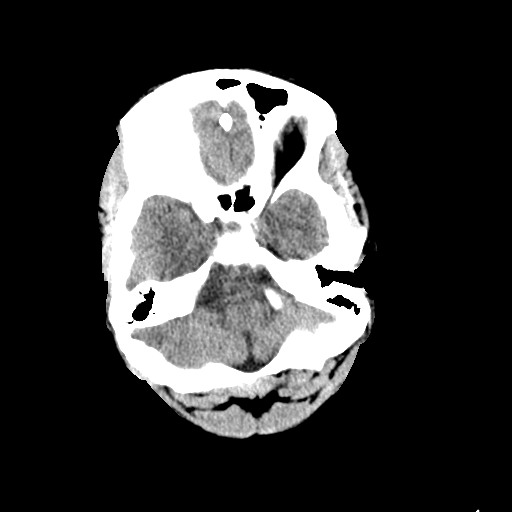
[im 9/30  brain]
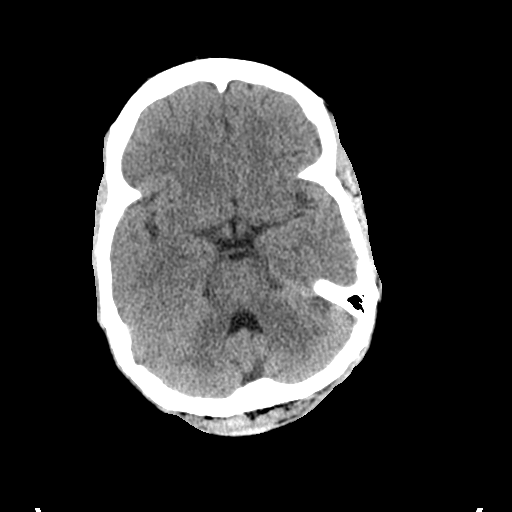
[im 11/30  brain]
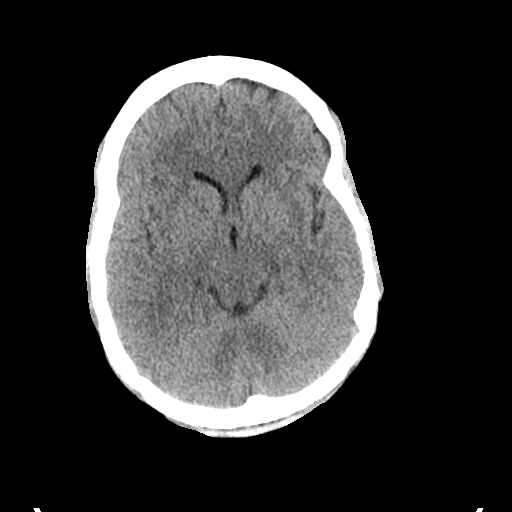
[im 14/30  brain]
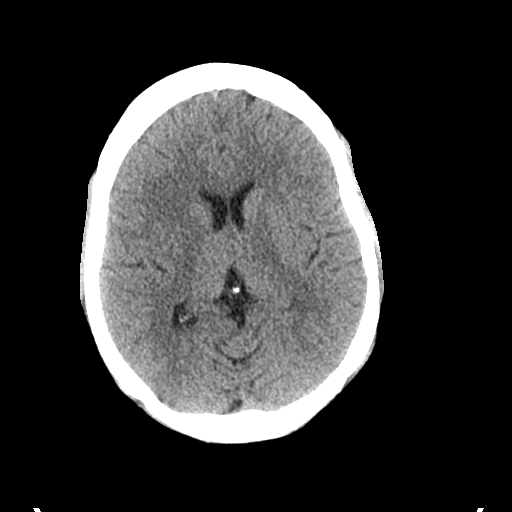
[im 14/30  bone]
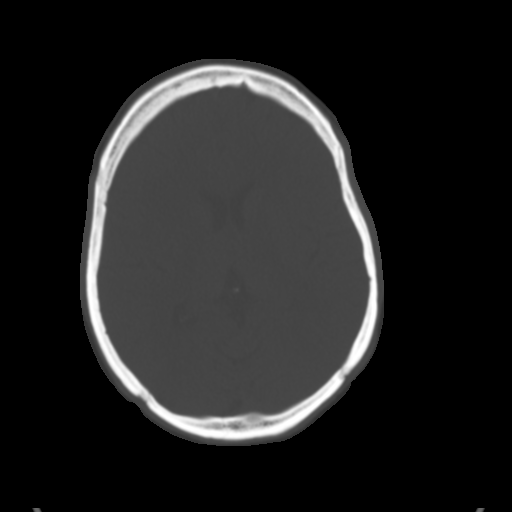
[im 17/30  brain]
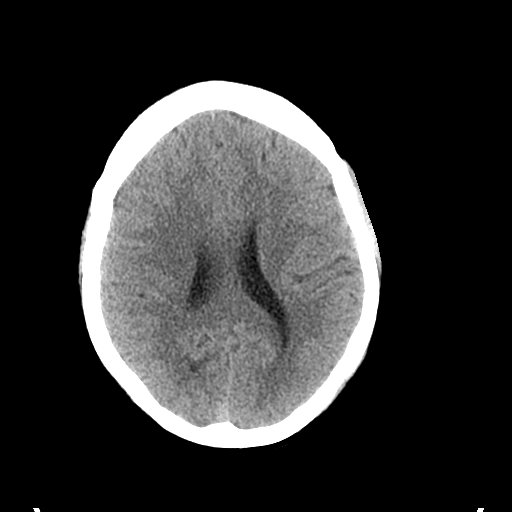
[im 20/30  brain]
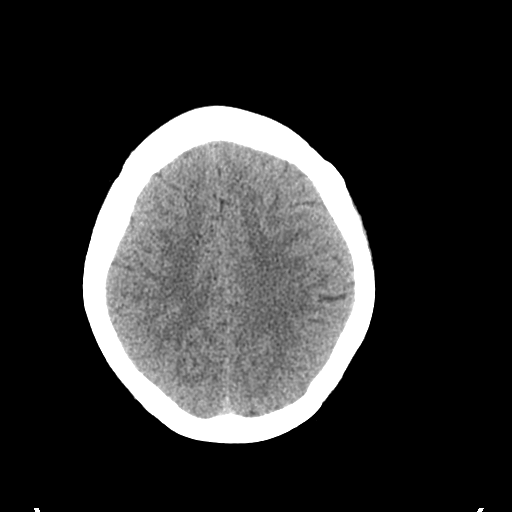
[im 23/30  brain]
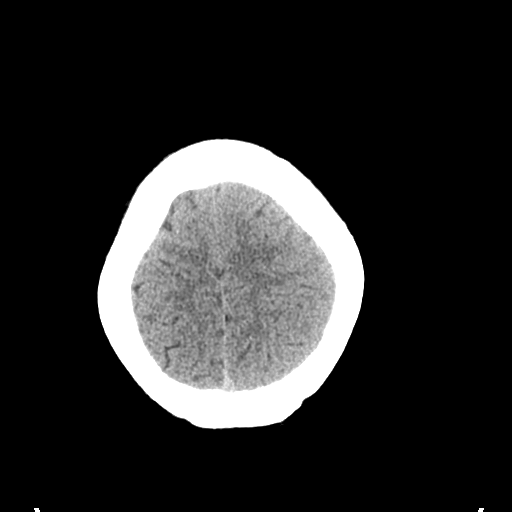
[im 25/30  brain]
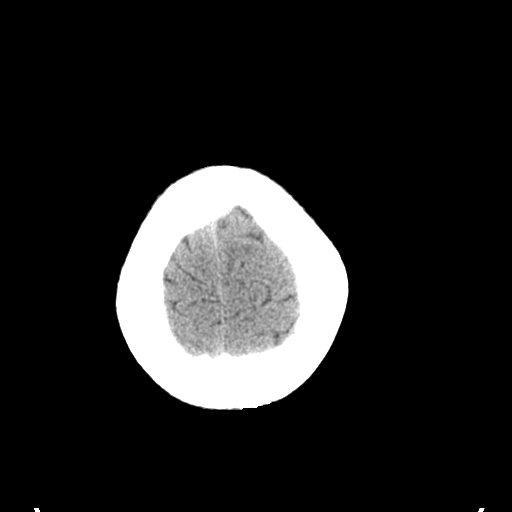
[im 25/30  bone]
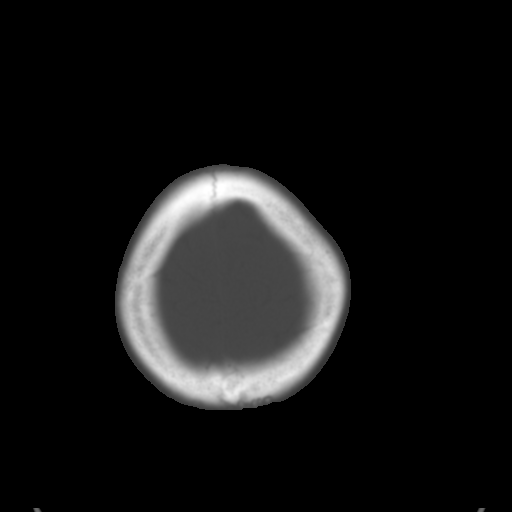
[im 28/30  brain]
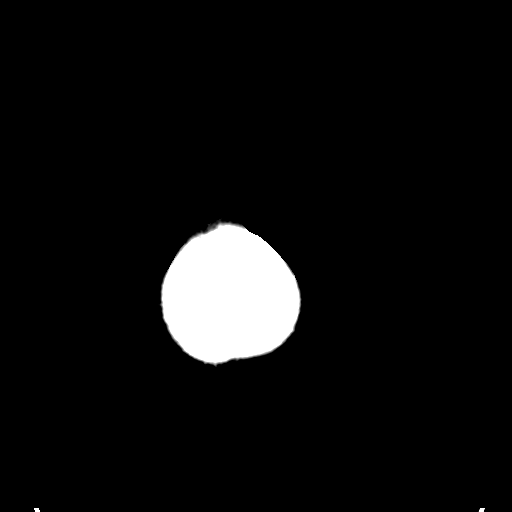

[Series 4: coronal soft tissue · coronal · 0.29mm/px · 3 of 62 slices shown]
[im 21/62  brain]
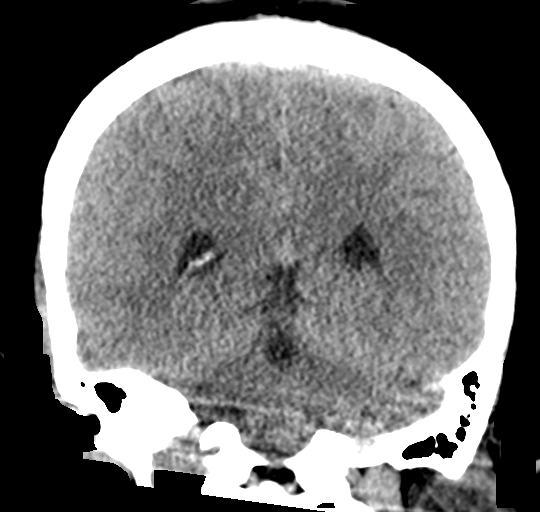
[im 28/62  brain]
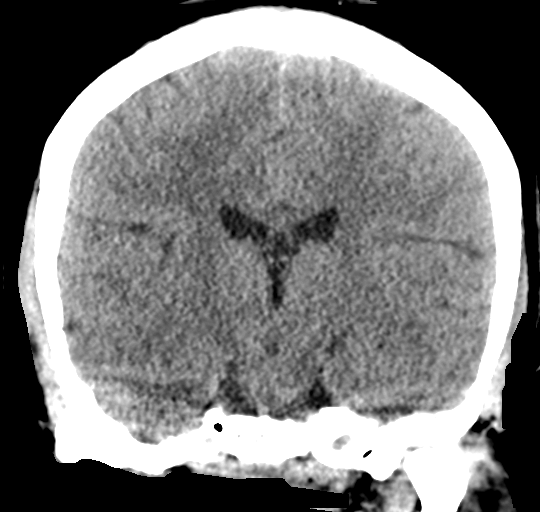
[im 34/62  brain]
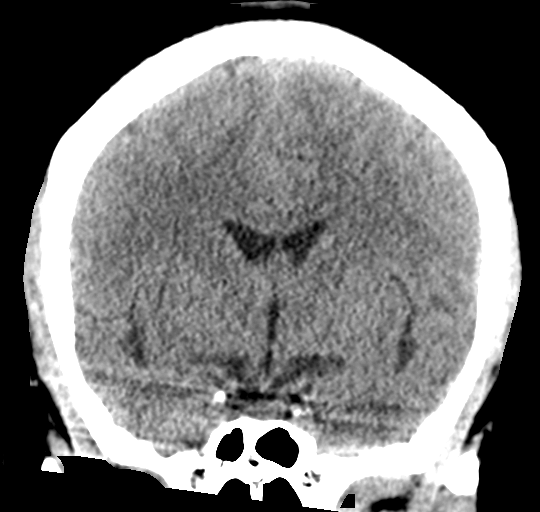

[Series 5: sagittal soft tissue · sagittal · 0.29mm/px · 3 of 50 slices shown]
[im 17/50  brain]
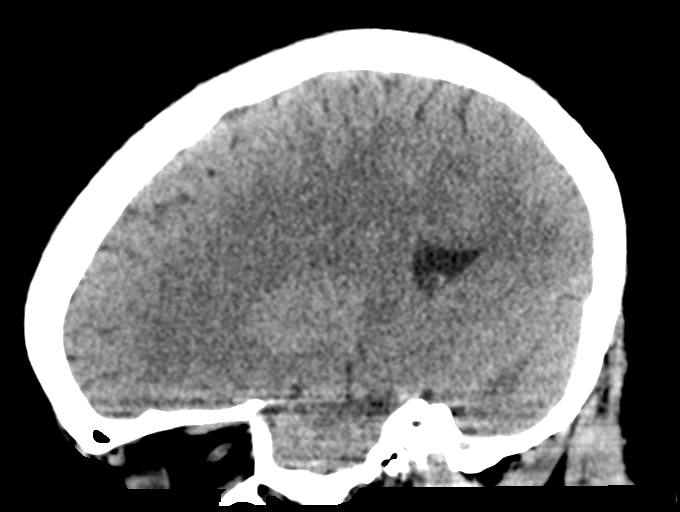
[im 25/50  brain]
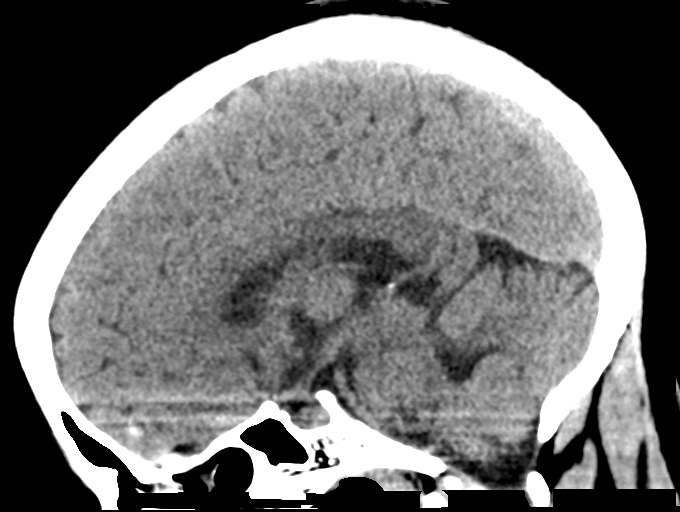
[im 33/50  brain]
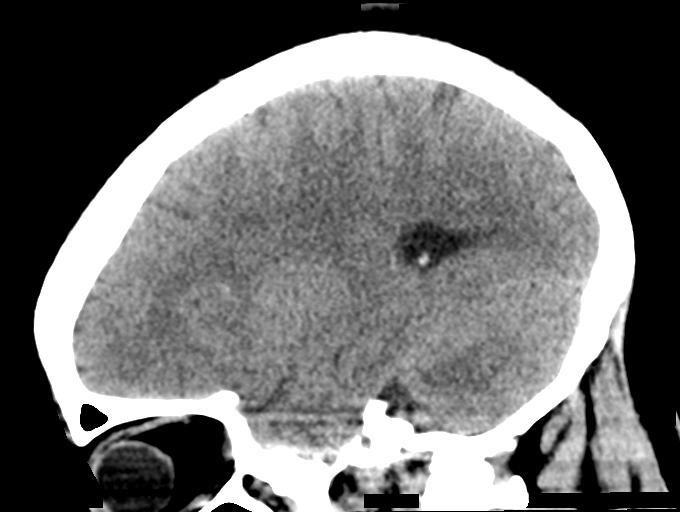

[16 of 47 positions shown; findings below may reference images not displayed]

FINDINGS: Brain: No evidence of acute infarction, hemorrhage, hydrocephalus,
extra-axial collection or mass lesion/mass effect.

Vascular: No hyperdense vessel or unexpected calcification.

Skull: Normal. Negative for fracture or focal lesion.

Sinuses/Orbits: No acute finding.
IMPRESSION: Negative head CT.
